# Patient Record
Sex: Male | Born: 1955 | Race: White | Hispanic: No | Marital: Single | Smoking: Current every day smoker
Health system: Southern US, Community
[De-identification: ages and names within clinical notes are randomized; demographics above are authoritative.]

## PROBLEM LIST (undated history)

## (undated) DIAGNOSIS — I779 Disorder of arteries and arterioles, unspecified: Secondary | ICD-10-CM

## (undated) DIAGNOSIS — I6522 Occlusion and stenosis of left carotid artery: Secondary | ICD-10-CM

## (undated) DIAGNOSIS — Z8719 Personal history of other diseases of the digestive system: Secondary | ICD-10-CM

## (undated) DIAGNOSIS — J449 Chronic obstructive pulmonary disease, unspecified: Secondary | ICD-10-CM

## (undated) DIAGNOSIS — I6529 Occlusion and stenosis of unspecified carotid artery: Secondary | ICD-10-CM

## (undated) DIAGNOSIS — I700 Atherosclerosis of aorta: Secondary | ICD-10-CM

## (undated) DIAGNOSIS — E119 Type 2 diabetes mellitus without complications: Secondary | ICD-10-CM

## (undated) DIAGNOSIS — F419 Anxiety disorder, unspecified: Secondary | ICD-10-CM

## (undated) DIAGNOSIS — I5032 Chronic diastolic (congestive) heart failure: Secondary | ICD-10-CM

## (undated) DIAGNOSIS — I2510 Atherosclerotic heart disease of native coronary artery without angina pectoris: Secondary | ICD-10-CM

## (undated) DIAGNOSIS — Z720 Tobacco use: Secondary | ICD-10-CM

## (undated) DIAGNOSIS — J309 Allergic rhinitis, unspecified: Secondary | ICD-10-CM

## (undated) DIAGNOSIS — K219 Gastro-esophageal reflux disease without esophagitis: Secondary | ICD-10-CM

## (undated) DIAGNOSIS — Z9289 Personal history of other medical treatment: Secondary | ICD-10-CM

## (undated) DIAGNOSIS — I4510 Unspecified right bundle-branch block: Secondary | ICD-10-CM

## (undated) DIAGNOSIS — R918 Other nonspecific abnormal finding of lung field: Secondary | ICD-10-CM

## (undated) DIAGNOSIS — M1990 Unspecified osteoarthritis, unspecified site: Secondary | ICD-10-CM

## (undated) HISTORY — DX: Atherosclerotic heart disease of native coronary artery without angina pectoris: I25.10

## (undated) HISTORY — PX: COLONOSCOPY W/ POLYPECTOMY: SHX1380

## (undated) HISTORY — DX: Personal history of other medical treatment: Z92.89

## (undated) HISTORY — DX: Tobacco use: Z72.0

## (undated) HISTORY — DX: Allergic rhinitis, unspecified: J30.9

## (undated) HISTORY — DX: Other nonspecific abnormal finding of lung field: R91.8

## (undated) HISTORY — DX: Type 2 diabetes mellitus without complications: E11.9

## (undated) HISTORY — DX: Atherosclerosis of aorta: I70.0

## (undated) HISTORY — DX: Occlusion and stenosis of unspecified carotid artery: I65.29

## (undated) HISTORY — DX: Chronic diastolic (congestive) heart failure: I50.32

## (undated) HISTORY — PX: WISDOM TOOTH EXTRACTION: SHX21

## (undated) HISTORY — DX: Disorder of arteries and arterioles, unspecified: I77.9

## (undated) HISTORY — PX: TONSILLECTOMY: SUR1361

## (undated) HISTORY — DX: Unspecified right bundle-branch block: I45.10

---

## 2006-01-07 ENCOUNTER — Encounter: Admission: RE | Admit: 2006-01-07 | Discharge: 2006-01-07 | Payer: Self-pay | Admitting: Internal Medicine

## 2010-09-24 ENCOUNTER — Encounter: Payer: Self-pay | Admitting: Internal Medicine

## 2014-03-03 ENCOUNTER — Ambulatory Visit
Admission: RE | Admit: 2014-03-03 | Discharge: 2014-03-03 | Disposition: A | Discharge status: Home / Self Care | Payer: BC Managed Care – PPO | Source: Ambulatory Visit | Attending: Internal Medicine | Admitting: Internal Medicine

## 2014-03-03 ENCOUNTER — Other Ambulatory Visit: Payer: Self-pay | Admitting: Internal Medicine

## 2014-03-03 DIAGNOSIS — F17200 Nicotine dependence, unspecified, uncomplicated: Secondary | ICD-10-CM

## 2014-12-09 ENCOUNTER — Ambulatory Visit (INDEPENDENT_AMBULATORY_CARE_PROVIDER_SITE_OTHER): Payer: BLUE CROSS/BLUE SHIELD | Admitting: Podiatry

## 2014-12-09 ENCOUNTER — Encounter: Payer: Self-pay | Admitting: Podiatry

## 2014-12-09 ENCOUNTER — Ambulatory Visit (INDEPENDENT_AMBULATORY_CARE_PROVIDER_SITE_OTHER): Payer: BLUE CROSS/BLUE SHIELD

## 2014-12-09 VITALS — BP 116/77 | HR 85 | Resp 16 | Ht 66.0 in | Wt 115.0 lb

## 2014-12-09 DIAGNOSIS — M722 Plantar fascial fibromatosis: Secondary | ICD-10-CM

## 2014-12-09 NOTE — Progress Notes (Signed)
   Subjective:    Patient ID: Jose Welch, male    DOB: [birth year], 58 y.o.   MRN: [id number]  HPI 59 year old male presents the office today with complaints of the soft tissue mass on the right foot. He states that he first noticed the mass last week. He states that it was larger has decreased. He was soaking his foot in Epson salts what seems to help alleviate some of the symptoms. He states he has pain particularly with pressure and weightbearing. Denies any overlying skin change or any coloration changes. No other complaints at this time.   Review of Systems  HENT: Positive for sinus pressure.   Respiratory: Positive for cough.   All other systems reviewed and are negative.      Objective:   Physical Exam AAO x3, NAD DP/PT pulses palpable bilaterally, CRT less than 3 seconds Protective sensation intact with Simms Weinstein monofilament, vibratory sensation intact, Achilles tendon reflex intact On the plantar aspect of the right foot just proximal to the second metatarsal to there is approximately 1 x 1 cm encapsulated firm soft tissue mass. There is tenderness to palpation directly overlying the mass. There is no overlying skin change. There is no overlying erythema, increase in warmth, edema. There is no other masses identified bilaterally. No areas of tenderness to bilateral lower extremities. MMT 5/5, ROM WNL.  No open lesions or pre-ulcerative lesions.  No overlying edema, erythema, increase in warmth to bilateral lower extremities.  No pain with calf compression, swelling, warmth, erythema bilaterally.      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 -Discussed likely etiology of the mass. -Discussed various treatment options with him. This time his left proceed with steroid injection. Risks  and complications of injection were discussed the patient for which he verbally understood and consented 2. Under sterile conditions a total of 1 mL mixture of dexamethasone phosphate and 0.5% Marcaine plain was infiltrated into and around the soft tissue mass. Patient tolerated the injection well without complications. Post injection care was discussed the patient. -Dispensed offloading pads. -Follow-up in 3 weeks or sooner if any problems are to arise. If symptoms continue and will likely obtain an MRI. Follow-up with PCP for other she is mentioned in the review of systems

## 2014-12-10 ENCOUNTER — Encounter: Payer: Self-pay | Admitting: Podiatry

## 2014-12-17 ENCOUNTER — Ambulatory Visit: Payer: Self-pay | Admitting: Podiatry

## 2014-12-30 ENCOUNTER — Ambulatory Visit: Payer: BLUE CROSS/BLUE SHIELD | Admitting: Podiatry

## 2015-04-28 ENCOUNTER — Other Ambulatory Visit: Payer: Self-pay | Admitting: Gastroenterology

## 2015-07-07 ENCOUNTER — Encounter (HOSPITAL_COMMUNITY): Payer: Self-pay | Admitting: *Deleted

## 2015-07-08 ENCOUNTER — Other Ambulatory Visit: Payer: Self-pay | Admitting: Gastroenterology

## 2015-07-18 ENCOUNTER — Encounter (HOSPITAL_COMMUNITY)
Admission: RE | Disposition: A | Discharge status: Home / Self Care | Payer: Self-pay | Source: Ambulatory Visit | Attending: Gastroenterology

## 2015-07-18 ENCOUNTER — Ambulatory Visit (HOSPITAL_COMMUNITY): Payer: BLUE CROSS/BLUE SHIELD | Admitting: Anesthesiology

## 2015-07-18 ENCOUNTER — Encounter (HOSPITAL_COMMUNITY): Payer: Self-pay

## 2015-07-18 ENCOUNTER — Ambulatory Visit (HOSPITAL_COMMUNITY)
Admission: RE | Admit: 2015-07-18 | Discharge: 2015-07-18 | Disposition: A | Discharge status: Home / Self Care | Payer: BLUE CROSS/BLUE SHIELD | Source: Ambulatory Visit | Attending: Gastroenterology | Admitting: Gastroenterology

## 2015-07-18 DIAGNOSIS — Z1211 Encounter for screening for malignant neoplasm of colon: Secondary | ICD-10-CM | POA: Insufficient documentation

## 2015-07-18 DIAGNOSIS — F17210 Nicotine dependence, cigarettes, uncomplicated: Secondary | ICD-10-CM | POA: Insufficient documentation

## 2015-07-18 DIAGNOSIS — J449 Chronic obstructive pulmonary disease, unspecified: Secondary | ICD-10-CM | POA: Insufficient documentation

## 2015-07-18 DIAGNOSIS — K219 Gastro-esophageal reflux disease without esophagitis: Secondary | ICD-10-CM | POA: Diagnosis not present

## 2015-07-18 DIAGNOSIS — R911 Solitary pulmonary nodule: Secondary | ICD-10-CM | POA: Insufficient documentation

## 2015-07-18 HISTORY — DX: Gastro-esophageal reflux disease without esophagitis: K21.9

## 2015-07-18 HISTORY — DX: Anxiety disorder, unspecified: F41.9

## 2015-07-18 HISTORY — PX: COLONOSCOPY WITH PROPOFOL: SHX5780

## 2015-07-18 SURGERY — COLONOSCOPY WITH PROPOFOL
Anesthesia: Monitor Anesthesia Care

## 2015-07-18 MED ORDER — SODIUM CHLORIDE 0.9 % IV SOLN: INTRAVENOUS | Status: DC

## 2015-07-18 MED ORDER — PROPOFOL 10 MG/ML IV BOLUS
INTRAVENOUS | Status: AC
  Filled 2015-07-18: qty 20

## 2015-07-18 MED ORDER — FENTANYL CITRATE (PF) 100 MCG/2ML IJ SOLN
25.0000 ug | INTRAMUSCULAR | Status: DC | PRN
Start: 2015-07-18 — End: 2015-07-18

## 2015-07-18 MED ORDER — OXYCODONE HCL 5 MG PO TABS: 5.0000 mg | ORAL_TABLET | Freq: Once | ORAL | Status: DC | PRN

## 2015-07-18 MED ORDER — LIDOCAINE HCL (CARDIAC) 20 MG/ML IV SOLN
INTRAVENOUS | Status: DC | PRN
  Administered 2015-07-18 (×2): 50 mg via INTRAVENOUS

## 2015-07-18 MED ORDER — LIDOCAINE HCL (CARDIAC) 20 MG/ML IV SOLN
INTRAVENOUS | Status: AC
  Filled 2015-07-18: qty 5

## 2015-07-18 MED ORDER — PROPOFOL 10 MG/ML IV BOLUS
INTRAVENOUS | Status: DC | PRN
  Administered 2015-07-18: 10 mg via INTRAVENOUS
  Administered 2015-07-18: 50 mg via INTRAVENOUS
  Administered 2015-07-18: 20 mg via INTRAVENOUS
  Administered 2015-07-18: 10 mg via INTRAVENOUS
  Administered 2015-07-18: 30 mg via INTRAVENOUS
  Administered 2015-07-18: 40 mg via INTRAVENOUS
  Administered 2015-07-18: 20 mg via INTRAVENOUS
  Administered 2015-07-18: 50 mg via INTRAVENOUS
  Administered 2015-07-18: 20 mg via INTRAVENOUS
  Administered 2015-07-18: 10 mg via INTRAVENOUS

## 2015-07-18 MED ORDER — ONDANSETRON HCL 4 MG/2ML IJ SOLN
4.0000 mg | Freq: Four times a day (QID) | INTRAMUSCULAR | Status: DC | PRN
Start: 2015-07-18 — End: 2015-07-18

## 2015-07-18 MED ORDER — LACTATED RINGERS IV SOLN
INTRAVENOUS | Status: DC
  Administered 2015-07-18: 1000 mL via INTRAVENOUS

## 2015-07-18 MED ORDER — OXYCODONE HCL 5 MG/5ML PO SOLN: 5.0000 mg | Freq: Once | ORAL | Status: DC | PRN

## 2015-07-18 SURGICAL SUPPLY — 21 items

## 2015-07-18 NOTE — Discharge Instructions (Signed)
Colonoscopy, Care After °Refer to this sheet in the next few weeks. These instructions provide you with information on caring for yourself after your procedure. Your health care provider may also give you more specific instructions. Your treatment has been planned according to current medical practices, but problems sometimes occur. Call your health care provider if you have any problems or questions after your procedure. °WHAT TO EXPECT AFTER THE PROCEDURE  °After your procedure, it is typical to have the following: °· A small amount of blood in your stool. °· Moderate amounts of gas and mild abdominal cramping or bloating. °HOME CARE INSTRUCTIONS °· Do not drive, operate machinery, or sign important documents for 24 hours. °· You may shower and resume your regular physical activities, but move at a slower pace for the first 24 hours. °· Take frequent rest periods for the first 24 hours. °· Walk around or put a warm pack on your abdomen to help reduce abdominal cramping and bloating. °· Drink enough fluids to keep your urine clear or pale yellow. °· You may resume your normal diet as instructed by your health care provider. Avoid heavy or fried foods that are hard to digest. °· Avoid drinking alcohol for 24 hours or as instructed by your health care provider. °· Only take over-the-counter or prescription medicines as directed by your health care provider. °· If a tissue sample (biopsy) was taken during your procedure: °¨ Do not take aspirin or blood thinners for 7 days, or as instructed by your health care provider. °¨ Do not drink alcohol for 7 days, or as instructed by your health care provider. °¨ Eat soft foods for the first 24 hours. °SEEK MEDICAL CARE IF: °You have persistent spotting of blood in your stool 2-3 days after the procedure. °SEEK IMMEDIATE MEDICAL CARE IF: °· You have more than a small spotting of blood in your stool. °· You pass large blood clots in your stool. °· Your abdomen is swollen  (distended). °· You have nausea or vomiting. °· You have a fever. °· You have increasing abdominal pain that is not relieved with medicine. °  °This information is not intended to replace advice given to you by your health care provider. Make sure you discuss any questions you have with your health care provider. °  °Document Released: 04/03/2004 Document Revised: 06/10/2013 Document Reviewed: 04/27/2013 °Elsevier Interactive Patient Education ©2016 [employer] ° °

## 2015-07-18 NOTE — H&P (Signed)
  Procedure: Screening colonoscopy  History: The patient is a 59 year old male born 08/15/1956. He is scheduled to undergo a screening colonoscopy today.  Past medical history: Chronic cigarette smoking. Tonsillectomy as a child. Gastroesophageal reflux. Insomnia. Chronic obstructive pulmonary disease. Right lung nodule.  Exam: The patient is alert and lying comfortably on the endoscopy stretcher. Abdomen is soft and nontender to palpation. Lungs are clear to auscultation. Cardiac exam reveals a regular rhythm.  Plan: Proceed with screening colonoscopy

## 2015-07-18 NOTE — Anesthesia Preprocedure Evaluation (Signed)
Anesthesia Evaluation  Patient identified by MRN, date of birth, ID band Patient awake    Reviewed: Allergy & Precautions, NPO status   Airway Mallampati: II   Neck ROM: full    Dental   Pulmonary Current Smoker,    breath sounds clear to auscultation       Cardiovascular negative cardio ROS   Rhythm:regular Rate:Normal     Neuro/Psych Anxiety    GI/Hepatic GERD  ,  Endo/Other    Renal/GU      Musculoskeletal   Abdominal   Peds  Hematology   Anesthesia Other Findings   Reproductive/Obstetrics                             Anesthesia Physical Anesthesia Plan  ASA: II  Anesthesia Plan: MAC   Post-op Pain Management:    Induction: Intravenous  Airway Management Planned: Simple Face Mask  Additional Equipment:   Intra-op Plan:   Post-operative Plan:   Informed Consent: I have reviewed the patients History and Physical, chart, labs and discussed the procedure including the risks, benefits and alternatives for the proposed anesthesia with the patient or authorized representative who has indicated his/her understanding and acceptance.     Plan Discussed with: CRNA, Anesthesiologist and Surgeon  Anesthesia Plan Comments:         Anesthesia Quick Evaluation

## 2015-07-18 NOTE — Transfer of Care (Signed)
Immediate Anesthesia Transfer of Care Note  Patient: Jose Welch  Procedure(s) Performed: Procedure(s): COLONOSCOPY WITH PROPOFOL (N/A)  Patient Location: PACU, endo recovery  Anesthesia Type:MAC  Level of Consciousness: Patient easily awoken, sedated, comfortable, cooperative, following commands, responds to stimulation.   Airway & Oxygen Therapy: Patient spontaneously breathing, ventilating well, oxygen via simple oxygen mask.  Post-op Assessment: Report given to PACU RN, vital signs reviewed and stable, moving all extremities.   Post vital signs: Reviewed and stable.  Complications: No apparent anesthesia complications

## 2015-07-18 NOTE — Anesthesia Postprocedure Evaluation (Signed)
Anesthesia Post Note  Patient: [name]  Procedure(s) Performed: Procedure(s) (LRB): COLONOSCOPY WITH PROPOFOL (N/A)  Anesthesia type: MAC  Patient location: PACU  Post pain: Pain level controlled and Adequate analgesia  Post assessment: Post-op Vital signs reviewed, Patient's Cardiovascular Status Stable and Respiratory Function Stable  Last Vitals:  Filed Vitals:   07/18/15 0840  BP: 146/74  Pulse: 89  Temp:   Resp: 16    Post vital signs: Reviewed and stable  Level of consciousness: awake, alert  and oriented  Complications: No apparent anesthesia complications

## 2015-07-18 NOTE — Op Note (Signed)
Procedure: Screening colonoscopy  Endoscopist: Earle Gell  Premedication: Propofol administered by anesthesia  Procedure: The patient was placed in the left lateral decubitus position. Anal inspection and digital rectal exam were normal. The Pentax pediatric colonoscope was introduced into the rectum and advanced to the cecum. A normal-appearing appendiceal orifice and ileocecal valve were identified. Colonic preparation for the exam today was good. Withdrawal time was 9 minutes  Rectum. Normal. Retroflexed view of the distal rectum was normal  Sigmoid colon and descending colon. Normal  Splenic flexure. Normal  Transverse colon. Normal  Hepatic flexure. Normal  Ascending colon. Normal  Cecum and ileocecal valve. Normal  Assessment: Normal screening colonoscopy  Recommendation: Schedule repeat screening colonoscopy in 10 years

## 2015-07-19 ENCOUNTER — Encounter (HOSPITAL_COMMUNITY): Payer: Self-pay | Admitting: Gastroenterology

## 2015-07-26 ENCOUNTER — Ambulatory Visit (INDEPENDENT_AMBULATORY_CARE_PROVIDER_SITE_OTHER): Payer: BLUE CROSS/BLUE SHIELD | Admitting: Podiatry

## 2015-07-26 ENCOUNTER — Ambulatory Visit (INDEPENDENT_AMBULATORY_CARE_PROVIDER_SITE_OTHER): Payer: BLUE CROSS/BLUE SHIELD

## 2015-07-26 DIAGNOSIS — M792 Neuralgia and neuritis, unspecified: Secondary | ICD-10-CM | POA: Diagnosis not present

## 2015-07-26 DIAGNOSIS — M216X1 Other acquired deformities of right foot: Secondary | ICD-10-CM

## 2015-07-26 DIAGNOSIS — R52 Pain, unspecified: Secondary | ICD-10-CM

## 2015-07-26 MED ORDER — METHYLPREDNISOLONE 4 MG PO TBPK: ORAL_TABLET | ORAL | Status: DC

## 2015-08-01 NOTE — Progress Notes (Signed)
Patient ID: Jose Welch, male   DOB: [birth year], 59 y.o.   MRN: [id number]  Subjective: 58 year old male presents the office today for concerns of right foot tingling in his foot and leg is asleep to the toes. Similar on the last couple weeks. He denies any recent injury or trauma. This was a slow onset. He states the majority of symptoms are the toes and the ball of his foot. He states that the mass after last appointment did resolve but once the mass did resolve he did have some of this numbness to his foot. There is no numbness or sensation to the back of his foot to the midfoot or more proximal. He denies any claudication symptoms. Denies any recent injury or trauma. He said no other treatment. No other complaints at this time.  Objective: General: AAO x3, NAD  Dermatological: Skin is warm, dry and supple bilateral. Nails x 10 are well manicured; remaining integument appears unremarkable at this time. There are no open sores, no preulcerative lesions, no rash or signs of infection present.  Vascular: Dorsalis Pedis artery and Posterior Tibial artery pedal pulses are 2/4 bilateral with immedate capillary fill time. Pedal hair growth present. No varicosities and no lower extremity edema present bilateral. There is no pain with calf compression, swelling, warmth, erythema.   Neruologic: Grossly intact via light touch bilateral. Vibratory intact via tuning fork bilateral. Protective threshold with Semmes Wienstein monofilament intact to all pedal sites bilateral. Patellar and Achilles deep tendon reflexes 2+ bilateral. No Babinski or clonus noted bilateral.   Musculoskeletal: No gross boney pedal deformities bilateral. There is tenderness with patient on the plantar aspect of the metatarsal heads on the 5 on the right foot there is atrophy of the fat pad and prominent metatarsal heads. There is no neuroma identified. There is no pain on the interspaces. There is no pain with MTPJ range of motion.  There is no specific area pinpoint line tenderness or pain vibratory sensation. On the plantar aspect of the right foot just proximal second metatarsal the soft tissue mass which is present last appointment appears to be resolved although this is where he discontinued get the majority the numbness. No other areas of tenderness to bilateral lower extremities. No pain, crepitus, or limitation noted with foot and ankle range of motion bilateral. Muscular strength 5/5 in all groups tested bilateral.  Gait: Unassisted, Nonantalgic.   Assessment: 59 year old male right foot neuritis symptoms  Plan: -Treatment options discussed including all alternatives, risks, and complications -X-rays were obtained and reviewed with the patient.  -Etiology of symptoms were discussed. Likely biomechanical in nature versus residual mass. Neuropathy unlikely. -Offloading pads were dispensed help take pressure off the metatarsal heads to see if this will limit some of the symptoms. -Prescribed Medrol Dosepak. -Continue supportive shoe gear. -If symptoms continue may need further studies. -Follow-up in the next 2-3 weeks or sooner if any problems arise. In the meantime, encouraged to call the office with any questions, concerns, change in symptoms.   Celesta Gentile, DPM

## 2015-08-18 ENCOUNTER — Ambulatory Visit: Payer: BLUE CROSS/BLUE SHIELD | Admitting: Podiatry

## 2016-04-16 DIAGNOSIS — Z1159 Encounter for screening for other viral diseases: Secondary | ICD-10-CM | POA: Diagnosis not present

## 2016-04-16 DIAGNOSIS — Z1389 Encounter for screening for other disorder: Secondary | ICD-10-CM | POA: Diagnosis not present

## 2016-04-16 DIAGNOSIS — Z125 Encounter for screening for malignant neoplasm of prostate: Secondary | ICD-10-CM | POA: Diagnosis not present

## 2016-04-16 DIAGNOSIS — Z0000 Encounter for general adult medical examination without abnormal findings: Secondary | ICD-10-CM | POA: Diagnosis not present

## 2016-04-16 DIAGNOSIS — Z23 Encounter for immunization: Secondary | ICD-10-CM | POA: Diagnosis not present

## 2016-04-24 DIAGNOSIS — J449 Chronic obstructive pulmonary disease, unspecified: Secondary | ICD-10-CM | POA: Diagnosis not present

## 2016-04-26 ENCOUNTER — Ambulatory Visit (INDEPENDENT_AMBULATORY_CARE_PROVIDER_SITE_OTHER): Payer: BLUE CROSS/BLUE SHIELD

## 2016-04-26 ENCOUNTER — Encounter: Payer: Self-pay | Admitting: Podiatry

## 2016-04-26 ENCOUNTER — Ambulatory Visit (INDEPENDENT_AMBULATORY_CARE_PROVIDER_SITE_OTHER): Payer: BLUE CROSS/BLUE SHIELD | Admitting: Podiatry

## 2016-04-26 DIAGNOSIS — M25473 Effusion, unspecified ankle: Secondary | ICD-10-CM | POA: Diagnosis not present

## 2016-04-26 DIAGNOSIS — I8390 Asymptomatic varicose veins of unspecified lower extremity: Secondary | ICD-10-CM

## 2016-04-26 DIAGNOSIS — R52 Pain, unspecified: Secondary | ICD-10-CM

## 2016-04-26 DIAGNOSIS — M216X1 Other acquired deformities of right foot: Secondary | ICD-10-CM

## 2016-04-26 DIAGNOSIS — M792 Neuralgia and neuritis, unspecified: Secondary | ICD-10-CM

## 2016-04-26 DIAGNOSIS — I868 Varicose veins of other specified sites: Secondary | ICD-10-CM

## 2016-04-26 MED ORDER — MELOXICAM 15 MG PO TABS: 15.0000 mg | ORAL_TABLET | Freq: Every day | ORAL | 2 refills | Status: DC

## 2016-04-26 NOTE — Progress Notes (Signed)
Subjective: 60 year old male presents the office today for concerns of his foot and ankle swelling which is been ongoing for quite some times uses no some varicose veins to his ankles. He is inquiring about possible compression socks. 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 Occasional numbness to the right foot and the ball the foot at times. Improvement in ongoing for quite some time. Did improve after his last appointment but is started to come back. Denies any recent injury or trauma. No drainage increase in warmth at this time. No recent treatment otherwise. Denies any systemic complaints such as fevers, chills, nausea, vomiting. No acute changes since last appointment, and no other complaints at this time.   Objective: *The patient would not let me straighten his feet out in order to evaluate him except for short time AAO x3, NAD DP/PT pulses palpable bilaterally, CRT less than 3 seconds Sensation intact with Simms Weinstein monofilament, vibratory sensation intact. There is prominent metatarsal heads plantarly with atrophy of fat pad. There is no specific area pinpoint bony tenderness or pain the vibratory sensation of bilateral foot and ankle. There is trace edema to the right and left ankle with some varicose veins no erythema or increase in warmth. He is able to walk any pain. No edema, erythema, increase in warmth to bilateral lower extremities.  No open lesions or pre-ulcerative lesions.  No pain with calf compression, swelling, warmth, erythema  Assessment: Metatarsalgia, varicose veins with ankle swelling  Plan: -All treatment options discussed with the patient including all alternatives, risks, complications.  -Dispensed anklet Phyllis swelling as well as provided prescriptions for compression socks at his request to help with varicose veins with swelling as he stands all day at work. Dispensed metatarsal offloading pads. Prescribed mobic. Discussed side effects of the  medication and directed to stop if any are to occur and call the office.  -Follow-up as scheduled or sooner if needed. -Patient encouraged to call the office with any questions, concerns, change in symptoms.   Celesta Gentile, DPM

## 2016-04-27 ENCOUNTER — Telehealth: Payer: Self-pay | Admitting: *Deleted

## 2016-04-27 NOTE — Telephone Encounter (Signed)
Pt asked where the medication was sent to. Left message informing pt the Meloxicam had been sent to the [facility] on 04/26/2016 at 4:24pm and confirmed received.

## 2016-10-17 DIAGNOSIS — F17210 Nicotine dependence, cigarettes, uncomplicated: Secondary | ICD-10-CM | POA: Diagnosis not present

## 2016-10-17 DIAGNOSIS — E46 Unspecified protein-calorie malnutrition: Secondary | ICD-10-CM | POA: Diagnosis not present

## 2016-10-17 DIAGNOSIS — J449 Chronic obstructive pulmonary disease, unspecified: Secondary | ICD-10-CM | POA: Diagnosis not present

## 2016-10-17 DIAGNOSIS — K219 Gastro-esophageal reflux disease without esophagitis: Secondary | ICD-10-CM | POA: Diagnosis not present

## 2017-01-15 DIAGNOSIS — H269 Unspecified cataract: Secondary | ICD-10-CM | POA: Diagnosis not present

## 2017-01-15 DIAGNOSIS — H539 Unspecified visual disturbance: Secondary | ICD-10-CM | POA: Diagnosis not present

## 2017-01-15 DIAGNOSIS — H2513 Age-related nuclear cataract, bilateral: Secondary | ICD-10-CM | POA: Diagnosis not present

## 2017-01-17 ENCOUNTER — Other Ambulatory Visit: Payer: Self-pay | Admitting: Internal Medicine

## 2017-01-17 DIAGNOSIS — H538 Other visual disturbances: Secondary | ICD-10-CM

## 2017-01-17 DIAGNOSIS — H539 Unspecified visual disturbance: Secondary | ICD-10-CM | POA: Diagnosis not present

## 2017-01-21 ENCOUNTER — Ambulatory Visit
Admission: RE | Admit: 2017-01-21 | Discharge: 2017-01-21 | Disposition: A | Discharge status: Home / Self Care | Payer: BLUE CROSS/BLUE SHIELD | Source: Ambulatory Visit | Attending: Internal Medicine | Admitting: Internal Medicine

## 2017-01-21 DIAGNOSIS — I6523 Occlusion and stenosis of bilateral carotid arteries: Secondary | ICD-10-CM | POA: Diagnosis not present

## 2017-01-21 DIAGNOSIS — H538 Other visual disturbances: Secondary | ICD-10-CM

## 2017-01-30 ENCOUNTER — Other Ambulatory Visit: Payer: Self-pay | Admitting: *Deleted

## 2017-01-30 DIAGNOSIS — I6521 Occlusion and stenosis of right carotid artery: Secondary | ICD-10-CM

## 2017-01-31 ENCOUNTER — Ambulatory Visit (HOSPITAL_COMMUNITY)
Admission: RE | Admit: 2017-01-31 | Discharge: 2017-01-31 | Disposition: A | Discharge status: Home / Self Care | Payer: BLUE CROSS/BLUE SHIELD | Source: Ambulatory Visit | Attending: Vascular Surgery | Admitting: Vascular Surgery

## 2017-01-31 DIAGNOSIS — I6521 Occlusion and stenosis of right carotid artery: Secondary | ICD-10-CM

## 2017-01-31 DIAGNOSIS — I6523 Occlusion and stenosis of bilateral carotid arteries: Secondary | ICD-10-CM | POA: Insufficient documentation

## 2017-01-31 LAB — VAS US CAROTID
LCCADSYS: 108 cm/s
LCCAPDIAS: 26 cm/s
LCCAPSYS: 94 cm/s
LEFT ECA DIAS: -36 cm/s
LEFT VERTEBRAL DIAS: -19 cm/s
LICADDIAS: -9 cm/s
LICADSYS: -23 cm/s
LICAPSYS: -189 cm/s
Left CCA dist dias: 28 cm/s
Left ICA prox dias: -52 cm/s
RCCAPSYS: 101 cm/s
RIGHT CCA MID DIAS: 24 cm/s
RIGHT ECA DIAS: -10 cm/s
RIGHT VERTEBRAL DIAS: -32 cm/s
Right CCA prox dias: 23 cm/s
Right cca dist sys: -61 cm/s

## 2017-02-07 ENCOUNTER — Encounter: Payer: Self-pay | Admitting: Surgery

## 2017-02-13 ENCOUNTER — Encounter: Payer: Self-pay | Admitting: Surgery

## 2017-02-13 ENCOUNTER — Ambulatory Visit (INDEPENDENT_AMBULATORY_CARE_PROVIDER_SITE_OTHER): Payer: BLUE CROSS/BLUE SHIELD | Admitting: Surgery

## 2017-02-13 VITALS — BP 145/88 | HR 89 | Temp 97.6°F | Resp 20 | Ht 66.0 in | Wt 110.0 lb

## 2017-02-13 DIAGNOSIS — I6523 Occlusion and stenosis of bilateral carotid arteries: Secondary | ICD-10-CM | POA: Diagnosis not present

## 2017-02-13 NOTE — Progress Notes (Signed)
[facility] [place]  Patient name: Jose Welch MRN: [id number] DOB: 1955/11/11 Sex: male   REQUESTING PROVIDER:    Dr. Lysle Rubens   REASON FOR CONSULT:    Carotid stenosis  HISTORY OF PRESENT ILLNESS:   Jose Welch is a 61 y.o. male, who is Referred today for evaluation of carotid stenosis.  The patient recently lost vision in his left eye.  This was done while he was at work.  He does describe bending down.  He was very hot.  This lasted about 30 seconds.  He says his vision wasn't white.  He has been seen by ophthalmology who did not find any abnormalities.  He had a carotid ultrasound which showed some disease on the right and minimal on the left.  The patient is a current everyday smoker.  He has a history of alcohol abuse.  He takes [place] powders every day.  He suffers from reflux disease.  PAST MEDICAL HISTORY    Past Medical History:  Diagnosis Date  . Anxiety   . GERD (gastroesophageal reflux disease)      FAMILY HISTORY   Negative for premature cardiovascular disease  SOCIAL HISTORY:   Social History   Social History  . Marital status: Single    Spouse name: N/A  . Number of children: N/A  . Years of education: N/A   Occupational History  . Not on file.   Social History Main Topics  . Smoking status: Current Every Day Smoker  . Smokeless tobacco: Not on file  . Alcohol use Not on file  . Drug use: No  . Sexual activity: Not on file   Other Topics Concern  . Not on file   Social History Narrative  . No narrative on file    ALLERGIES:    No Known Allergies  CURRENT MEDICATIONS:    Current Outpatient Prescriptions  Medication Sig Dispense Refill  . ALPRAZolam (XANAX) 0.25 MG tablet Take 0.25 mg by mouth at bedtime as needed for anxiety.    . Aspirin-Acetaminophen-Caffeine (GOODY HEADACHE PO) Take 1 packet by mouth 2 (two) times daily as needed (pain).    . meloxicam (MOBIC) 15 MG  tablet Take 1 tablet (15 mg total) by mouth daily. 30 tablet 2  . methylPREDNISolone (MEDROL DOSEPAK) 4 MG TBPK tablet Take as directed 21 tablet 0   No current facility-administered medications for this visit.     REVIEW OF SYSTEMS:   [X]  denotes positive finding, [ ]  denotes negative finding Cardiac  Comments:  Chest pain or chest pressure:    Shortness of breath upon exertion:    Short of breath when lying flat:    Irregular heart rhythm:        Vascular    Pain in calf, thigh, or hip brought on by ambulation:    Pain in feet at night that wakes you up from your sleep:     Blood clot in your veins:    Leg swelling:         Pulmonary    Oxygen at home:    Productive cough:     Wheezing:         Neurologic    Sudden weakness in arms or legs:     Sudden numbness in arms or legs:     Sudden onset of difficulty speaking or slurred speech:    Temporary loss of vision in one eye:     Problems with dizziness:  Gastrointestinal    Blood in stool:      Vomited blood:         Genitourinary    Burning when urinating:     Blood in urine:        Psychiatric    Major depression:         Hematologic    Bleeding problems:    Problems with blood clotting too easily:        Skin    Rashes or ulcers:        Constitutional    Fever or chills:     PHYSICAL EXAM:   There were no vitals filed for this visit.  GENERAL: The patient is a well-nourished male, in no acute distress. The vital signs are documented above. CARDIAC: There is a regular rate and rhythm.  VASCULAR: Palpable pedal pulses bilaterally.  Positive left carotid bruit. PULMONARY: Nonlabored respirations MUSCULOSKELETAL: There are no major deformities or cyanosis. NEUROLOGIC: No focal weakness or paresthesias are detected. SKIN: There are no ulcers or rashes noted. PSYCHIATRIC: The patient has a normal affect.  STUDIES:   Vascular lab studies were performed in my office which were independently  reviewed by myself with the following findings: 60-79% right carotid stenosis, 40-59% left carotid stenosis  ASSESSMENT and PLAN   Asymptomatic carotid stenosis: Based on the way the patient describes his episode of vision loss, it is not classic for amaurosis fugax.  In addition, he has only mild stenosis on the left based on duplex ultrasound.  Therefore, at this point I would only recommend medical management for his carotid disease.  We spent a significant amount of time discussing the importance of smoking cessation.  He understands that but is somewhat reluctant to consider it.  I would like for him to be started on a statin.  In addition I told him that I would like for him to take a baby aspirin.  I also cautioned him about taking Goody's powder every day.  I have him scheduled to see me back again in 6 months with a carotid duplex.   [name], MD [facility] [phone] Pager [phone]

## 2017-02-19 NOTE — Addendum Note (Signed)
Addended by: Lianne Cure A on: 02/19/2017 04:48 PM   Modules accepted: Orders

## 2017-04-17 DIAGNOSIS — K219 Gastro-esophageal reflux disease without esophagitis: Secondary | ICD-10-CM | POA: Diagnosis not present

## 2017-04-17 DIAGNOSIS — Z125 Encounter for screening for malignant neoplasm of prostate: Secondary | ICD-10-CM | POA: Diagnosis not present

## 2017-04-17 DIAGNOSIS — R7301 Impaired fasting glucose: Secondary | ICD-10-CM | POA: Diagnosis not present

## 2017-04-17 DIAGNOSIS — J449 Chronic obstructive pulmonary disease, unspecified: Secondary | ICD-10-CM | POA: Diagnosis not present

## 2017-04-17 DIAGNOSIS — F17210 Nicotine dependence, cigarettes, uncomplicated: Secondary | ICD-10-CM | POA: Diagnosis not present

## 2017-04-17 DIAGNOSIS — J310 Chronic rhinitis: Secondary | ICD-10-CM | POA: Diagnosis not present

## 2017-04-17 DIAGNOSIS — Z0000 Encounter for general adult medical examination without abnormal findings: Secondary | ICD-10-CM | POA: Diagnosis not present

## 2017-07-17 DIAGNOSIS — E782 Mixed hyperlipidemia: Secondary | ICD-10-CM | POA: Diagnosis not present

## 2017-07-17 DIAGNOSIS — R7303 Prediabetes: Secondary | ICD-10-CM | POA: Diagnosis not present

## 2017-08-19 ENCOUNTER — Ambulatory Visit (HOSPITAL_COMMUNITY)
Admission: RE | Admit: 2017-08-19 | Discharge: 2017-08-19 | Disposition: A | Discharge status: Home / Self Care | Payer: BLUE CROSS/BLUE SHIELD | Source: Ambulatory Visit | Attending: Surgery | Admitting: Surgery

## 2017-08-19 ENCOUNTER — Other Ambulatory Visit: Payer: Self-pay | Admitting: *Deleted

## 2017-08-19 ENCOUNTER — Encounter: Payer: Self-pay | Admitting: Family

## 2017-08-19 ENCOUNTER — Encounter: Payer: Self-pay | Admitting: *Deleted

## 2017-08-19 ENCOUNTER — Ambulatory Visit (INDEPENDENT_AMBULATORY_CARE_PROVIDER_SITE_OTHER): Payer: BLUE CROSS/BLUE SHIELD | Admitting: Surgery

## 2017-08-19 VITALS — BP 149/93 | HR 105 | Temp 97.5°F | Resp 18 | Wt 113.6 lb

## 2017-08-19 DIAGNOSIS — I6523 Occlusion and stenosis of bilateral carotid arteries: Secondary | ICD-10-CM | POA: Diagnosis not present

## 2017-08-19 DIAGNOSIS — F17200 Nicotine dependence, unspecified, uncomplicated: Secondary | ICD-10-CM

## 2017-08-19 DIAGNOSIS — Z01810 Encounter for preprocedural cardiovascular examination: Secondary | ICD-10-CM

## 2017-08-19 LAB — VAS US CAROTID
LEFT ECA DIAS: -9 cm/s
LEFT VERTEBRAL DIAS: 23 cm/s
LICAPDIAS: -226 cm/s
Left CCA dist dias: -42 cm/s
Left CCA dist sys: -119 cm/s
Left CCA prox dias: 30 cm/s
Left CCA prox sys: 82 cm/s
Left ICA dist dias: -35 cm/s
Left ICA dist sys: -83 cm/s
Left ICA prox sys: -503 cm/s
RCCAPDIAS: 21 cm/s
RCCAPSYS: 95 cm/s
RIGHT CCA MID DIAS: 29 cm/s
RIGHT ECA DIAS: 28 cm/s
RIGHT VERTEBRAL DIAS: -33 cm/s
Right cca dist sys: -108 cm/s

## 2017-08-19 NOTE — Patient Instructions (Signed)
Steps to Quit Smoking Smoking tobacco can be bad for your health. It can also affect almost every organ in your body. Smoking puts you and people around you at risk for many serious long-lasting (chronic) diseases. Quitting smoking is hard, but it is one of the best things that you can do for your health. It is never too late to quit. What are the benefits of quitting smoking? When you quit smoking, you lower your risk for getting serious diseases and conditions. They can include:  Lung cancer or lung disease.  Heart disease.  Stroke.  Heart attack.  Not being able to have children (infertility).  Weak bones (osteoporosis) and broken bones (fractures).  If you have coughing, wheezing, and shortness of breath, those symptoms may get better when you quit. You may also get sick less often. If you are pregnant, quitting smoking can help to lower your chances of having a baby of low birth weight. What can I do to help me quit smoking? Talk with your doctor about what can help you quit smoking. Some things you can do (strategies) include:  Quitting smoking totally, instead of slowly cutting back how much you smoke over a period of time.  Going to in-person counseling. You are more likely to quit if you go to many counseling sessions.  Using resources and support systems, such as: ? Online chats with a counselor. ? Phone quitlines. ? Printed self-help materials. ? Support groups or group counseling. ? Text messaging programs. ? Mobile phone apps or applications.  Taking medicines. Some of these medicines may have nicotine in them. If you are pregnant or breastfeeding, do not take any medicines to quit smoking unless your doctor says it is okay. Talk with your doctor about counseling or other things that can help you.  Talk with your doctor about using more than one strategy at the same time, such as taking medicines while you are also going to in-person counseling. This can help make  quitting easier. What things can I do to make it easier to quit? Quitting smoking might feel very hard at first, but there is a lot that you can do to make it easier. Take these steps:  Talk to your family and friends. Ask them to support and encourage you.  Call phone quitlines, reach out to support groups, or work with a counselor.  Ask people who smoke to not smoke around you.  Avoid places that make you want (trigger) to smoke, such as: ? Bars. ? Parties. ? Smoke-break areas at work.  Spend time with people who do not smoke.  Lower the stress in your life. Stress can make you want to smoke. Try these things to help your stress: ? Getting regular exercise. ? Deep-breathing exercises. ? Yoga. ? Meditating. ? Doing a body scan. To do this, close your eyes, focus on one area of your body at a time from head to toe, and notice which parts of your body are tense. Try to relax the muscles in those areas.  Download or buy apps on your mobile phone or tablet that can help you stick to your quit plan. There are many free apps, such as QuitGuide from the CDC (Centers for Disease Control and Prevention). You can find more support from smokefree.gov and other websites.  This information is not intended to replace advice given to you by your health care provider. Make sure you discuss any questions you have with your health care provider. Document Released: 06/16/2009 Document   Revised: 04/17/2016 Document Reviewed: 01/04/2015 Elsevier Interactive Patient Education  2018 Elsevier Inc.     Stroke Prevention Some health problems and behaviors may make it more likely for you to have a stroke. Below are ways to lessen your risk of having a stroke.  Be active for at least 30 minutes on most or all days.  Do not smoke. Try not to be around others who smoke.  Do not drink too much alcohol. ? Do not have more than 2 drinks a day if you are a man. ? Do not have more than 1 drink a day if you  are a woman and are not pregnant.  Eat healthy foods, such as fruits and vegetables. If you were put on a specific diet, follow the diet as told.  Keep your cholesterol levels under control through diet and medicines. Look for foods that are low in saturated fat, trans fat, cholesterol, and are high in fiber.  If you have diabetes, follow all diet plans and take your medicine as told.  Ask your doctor if you need treatment to lower your blood pressure. If you have high blood pressure (hypertension), follow all diet plans and take your medicine as told by your doctor.  If you are 18-39 years old, have your blood pressure checked every 3-5 years. If you are age 40 or older, have your blood pressure checked every year.  Keep a healthy weight. Eat foods that are low in calories, salt, saturated fat, trans fat, and cholesterol.  Do not take drugs.  Avoid birth control pills, if this applies. Talk to your doctor about the risks of taking birth control pills.  Talk to your doctor if you have sleep problems (sleep apnea).  Take all medicine as told by your doctor. ? You may be told to take aspirin or blood thinner medicine. Take this medicine as told by your doctor. ? Understand your medicine instructions.  Make sure any other conditions you have are being taken care of.  Get help right away if:  You suddenly lose feeling (you feel numb) or have weakness in your face, arm, or leg.  Your face or eyelid hangs down to one side.  You suddenly feel confused.  You have trouble talking (aphasia) or understanding what people are saying.  You suddenly have trouble seeing in one or both eyes.  You suddenly have trouble walking.  You are dizzy.  You lose your balance or your movements are clumsy (uncoordinated).  You suddenly have a very bad headache and you do not know the cause.  You have new chest pain.  Your heart feels like it is fluttering or skipping a beat (irregular  heartbeat). Do not wait to see if the symptoms above go away. Get help right away. Call your local emergency services (911 in [place] Do not drive yourself to the hospital. This information is not intended to replace advice given to you by your health care provider. Make sure you discuss any questions you have with your health care provider. Document Released: 02/19/2012 Document Revised: 01/26/2016 Document Reviewed: 02/20/2013 Elsevier Interactive Patient Education  2018 Elsevier Inc.  

## 2017-08-19 NOTE — H&P (View-Only) (Signed)
[facility]  Patient name: Jose Welch MRN: [id number] DOB: June 07, 1956 Sex: male   REASON FOR VISIT:    Follow-up carotid disease  HISOTRY OF PRESENT ILLNESS:    Jose Welch is a 61 y.o. male who is back today for six-month follow-up of his carotid disease.  I initially saw him in June 2018.  At that time he had a transient episode of vision loss in his left eye.  He describes this is happening for about 30 seconds while he bent down on a very hot day.  He was seen by ophthalmology who did not find any abnormalities.  A carotid duplex showed 40-59% left carotid stenosis and 60-79% right carotid stenosis.  The patient's episode was not felt to be amaurosis, and in addition the stenosis was not that significant on the left side and so medical therapy was recommended.  He is back today for a repeat ultrasound.  He has not had any interval symptoms.  The patient is a current everyday smoker.  He has a history of alcohol abuse.  He takes [place] powders every day.  He suffers from reflux disease.  PAST MEDICAL HISTORY:   Past Medical History:  Diagnosis Date  . Anxiety   . Carotid artery occlusion   . GERD (gastroesophageal reflux disease)      FAMILY HISTORY:   No family history on file.  SOCIAL HISTORY:   Social History   Tobacco Use  . Smoking status: Current Every Day Smoker    Packs/day: 1.00    Types: Cigarettes  . Smokeless tobacco: Never Used  Substance Use Topics  . Alcohol use: Yes    Alcohol/week: 0.0 oz    Comment: occasional     ALLERGIES:   No Known Allergies   CURRENT MEDICATIONS:   Current Outpatient Medications  Medication Sig Dispense Refill  . ALPRAZolam (XANAX) 0.25 MG tablet Take 0.25 mg by mouth at bedtime as needed for anxiety.    . Aspirin-Acetaminophen-Caffeine (GOODY HEADACHE PO) Take 1 packet by mouth 2 (two) times daily as needed (pain).    Marland Kitchen atorvastatin (LIPITOR)  10 MG tablet     . meloxicam (MOBIC) 15 MG tablet Take 1 tablet (15 mg total) by mouth daily. 30 tablet 2  . metFORMIN (GLUCOPHAGE) 500 MG tablet     . ONE TOUCH ULTRA TEST test strip     . ONETOUCH DELICA LANCETS FINE MISC     . ranitidine (ZANTAC) 150 MG tablet Take 150 mg by mouth 2 (two) times daily.     No current facility-administered medications for this visit.     REVIEW OF SYSTEMS:   [X]  denotes positive finding, [ ]  denotes negative finding Cardiac  Comments:  Chest pain or chest pressure:    Shortness of breath upon exertion:    Short of breath when lying flat:    Irregular heart rhythm:        Vascular    Pain in calf, thigh, or hip brought on by ambulation:    Pain in feet at night that wakes you up from your sleep:     Blood clot in your veins:    Leg swelling:         Pulmonary    Oxygen at home:    Productive cough:     Wheezing:         Neurologic    Sudden weakness in arms or legs:     Sudden numbness in  arms or legs:     Sudden onset of difficulty speaking or slurred speech:    Temporary loss of vision in one eye:     Problems with dizziness:         Gastrointestinal    Blood in stool:     Vomited blood:         Genitourinary    Burning when urinating:     Blood in urine:        Psychiatric    Major depression:         Hematologic    Bleeding problems:    Problems with blood clotting too easily:        Skin    Rashes or ulcers:        Constitutional    Fever or chills:      PHYSICAL EXAM:   Vitals:   08/19/17 1345 08/19/17 1346  BP: 136/80 (!) 149/93  Pulse: (!) 105   Resp: 18   Temp: (!) 97.5 F (36.4 C)   TempSrc: Oral   SpO2: 96%   Weight: 113 lb 9.6 oz (51.5 kg)     GENERAL: The patient is a well-nourished male, in no acute distress. The vital signs are documented above. CARDIAC: There is a regular rate and rhythm.  VASCULAR: No carotid bruits PULMONARY: Non-labored respirations MUSCULOSKELETAL: There are no major  deformities or cyanosis. NEUROLOGIC: No focal weakness or paresthesias are detected. SKIN: There are no ulcers or rashes noted. PSYCHIATRIC: The patient has a normal affect.  STUDIES:   I have ordered and reviewed his ultrasound study which shows a progression of the left-sided stenosis now measuring greater than 80%.  The right side remains stable at 60-79%  MEDICAL ISSUES:   High-grade left carotid stenosis, asymptomatic: I discussed the treatment options of stenting versus endarterectomy.  We have agreed to proceed with a left carotid endarterectomy.  I discussed the risks and benefits of the operation including the risk of nerve injury stroke, cardiopulmonary complications.  All of his questions were answered.  I will send him for a Myoview prior to his operation which has been scheduled for Friday, December 28    [name], MD [facility] [phone] Pager [phone]

## 2017-08-19 NOTE — Progress Notes (Signed)
[facility]ist of [facility]  Patient name: Jose Welch MRN: [id number] DOB: [birth year] Sex: male   REASON FOR VISIT:    Follow-up carotid disease  HISOTRY OF PRESENT ILLNESS:    Jose Welch is a 61 y.o. male who is back today for six-month follow-up of his carotid disease.  I initially saw him in June 2018.  At that time he had a transient episode of vision loss in his left eye.  He describes this is happening for about 30 seconds while he bent down on a very hot day.  He was seen by ophthalmology who did not find any abnormalities.  A carotid duplex showed 40-59% left carotid stenosis and 60-79% right carotid stenosis.  The patient's episode was not felt to be amaurosis, and in addition the stenosis was not that significant on the left side and so medical therapy was recommended.  He is back today for a repeat ultrasound.  He has not had any interval symptoms.  The patient is a current everyday smoker.  He has a history of alcohol abuse.  He takes [place] powders every day.  He suffers from reflux disease.  PAST MEDICAL HISTORY:   Past Medical History:  Diagnosis Date  . Anxiety   . Carotid artery occlusion   . GERD (gastroesophageal reflux disease)      FAMILY HISTORY:   No family history on file.  SOCIAL HISTORY:   Social History   Tobacco Use  . Smoking status: Current Every Day Smoker    Packs/day: 1.00    Types: Cigarettes  . Smokeless tobacco: Never Used  Substance Use Topics  . Alcohol use: Yes    Alcohol/week: 0.0 oz    Comment: occasional     ALLERGIES:   No Known Allergies   CURRENT MEDICATIONS:   Current Outpatient Medications  Medication Sig Dispense Refill  . ALPRAZolam (XANAX) 0.25 MG tablet Take 0.25 mg by mouth at bedtime as needed for anxiety.    . Aspirin-Acetaminophen-Caffeine (GOODY HEADACHE PO) Take 1 packet by mouth 2 (two) times daily as needed (pain).    Marland Kitchen atorvastatin (LIPITOR)  10 MG tablet     . meloxicam (MOBIC) 15 MG tablet Take 1 tablet (15 mg total) by mouth daily. 30 tablet 2  . metFORMIN (GLUCOPHAGE) 500 MG tablet     . ONE TOUCH ULTRA TEST test strip     . ONETOUCH DELICA LANCETS FINE MISC     . ranitidine (ZANTAC) 150 MG tablet Take 150 mg by mouth 2 (two) times daily.     No current facility-administered medications for this visit.     REVIEW OF SYSTEMS:   [X]  denotes positive finding, [ ]  denotes negative finding Cardiac  Comments:  Chest pain or chest pressure:    Shortness of breath upon exertion:    Short of breath when lying flat:    Irregular heart rhythm:        Vascular    Pain in calf, thigh, or hip brought on by ambulation:    Pain in feet at night that wakes you up from your sleep:     Blood clot in your veins:    Leg swelling:         Pulmonary    Oxygen at home:    Productive cough:     Wheezing:         Neurologic    Sudden weakness in arms or legs:     Sudden numbness in  arms or legs:     Sudden onset of difficulty speaking or slurred speech:    Temporary loss of vision in one eye:     Problems with dizziness:         Gastrointestinal    Blood in stool:     Vomited blood:         Genitourinary    Burning when urinating:     Blood in urine:        Psychiatric    Major depression:         Hematologic    Bleeding problems:    Problems with blood clotting too easily:        Skin    Rashes or ulcers:        Constitutional    Fever or chills:      PHYSICAL EXAM:   Vitals:   08/19/17 1345 08/19/17 1346  BP: 136/80 (!) 149/93  Pulse: (!) 105   Resp: 18   Temp: (!) 97.5 F (36.4 C)   TempSrc: Oral   SpO2: 96%   Weight: 113 lb 9.6 oz (51.5 kg)     GENERAL: The patient is a well-nourished male, in no acute distress. The vital signs are documented above. CARDIAC: There is a regular rate and rhythm.  VASCULAR: No carotid bruits PULMONARY: Non-labored respirations MUSCULOSKELETAL: There are no major  deformities or cyanosis. NEUROLOGIC: No focal weakness or paresthesias are detected. SKIN: There are no ulcers or rashes noted. PSYCHIATRIC: The patient has a normal affect.  STUDIES:   I have ordered and reviewed his ultrasound study which shows a progression of the left-sided stenosis now measuring greater than 80%.  The right side remains stable at 60-79%  MEDICAL ISSUES:   High-grade left carotid stenosis, asymptomatic: I discussed the treatment options of stenting versus endarterectomy.  We have agreed to proceed with a left carotid endarterectomy.  I discussed the risks and benefits of the operation including the risk of nerve injury stroke, cardiopulmonary complications.  All of his questions were answered.  I will send him for a Myoview prior to his operation which has been scheduled for Friday, December 28    [name], MD [facility] [phone] Pager [phone]

## 2017-08-20 ENCOUNTER — Other Ambulatory Visit: Payer: Self-pay | Admitting: *Deleted

## 2017-08-20 ENCOUNTER — Telehealth (HOSPITAL_COMMUNITY): Payer: Self-pay | Admitting: *Deleted

## 2017-08-20 NOTE — Telephone Encounter (Signed)
Left message on voicemail in reference to upcoming appointment scheduled for 08/23/17. Phone number given for a call back so details instructions can be given. Mysty Kielty, Ranae Palms

## 2017-08-20 NOTE — Telephone Encounter (Signed)
Patient given detailed instructions per Myocardial Perfusion Study Information Sheet for the test on 08/23/17 at 9:30. Patient notified to arrive 15 minutes early and that it is imperative to arrive on time for appointment to keep from having the test rescheduled.  If you need to cancel or reschedule your appointment, please call the office within 24 hours of your appointment. . Patient verbalized understanding.Jose Welch

## 2017-08-21 NOTE — Pre-Procedure Instructions (Signed)
Jose Welch  08/21/2017      [place], [place] - [address] Jose [address] [place] [address] Phone: [phone] Fax: [phone]    Your procedure is scheduled on Friday, August 30, 2017  Report to [facility] Admitting Entrance "A" at 10:20AM  Call this number if you have problems the morning of surgery:  [phone]   Remember:  Do not eat food or drink liquids after midnight.  Take these medicines the morning of surgery with A SIP OF WATER: If needed Ranitidine (ZANTAC) for acid reflux.  Follow your doctor's instruction regarding Aspirin.  7 days before surgery (Dec. 21), stop taking all Aspirins, Vitamins, Fish oils, and Herbal medications. Also stop all NSAIDS i.e. Advil, Ibuprofen, Motrin, Aleve, Anaprox, Naproxen, BC and Goody Powders.  How to Manage Your Diabetes Before and After Surgery  Why is it important to control my blood sugar before and after surgery? . Improving blood sugar levels before and after surgery helps healing and can limit problems. . A way of improving blood sugar control is eating a healthy diet by: o  Eating less sugar and carbohydrates o  Increasing activity/exercise o  Talking with your doctor about reaching your blood sugar goals . High blood sugars (greater than 180 mg/dL) can raise your risk of infections and slow your recovery, so you will need to focus on controlling your diabetes during the weeks before surgery. . Make sure that the doctor who takes care of your diabetes knows about your planned surgery including the date and location.  How do I manage my blood sugar before surgery? . Check your blood sugar at least 4 times a day, starting 2 days before surgery, to make sure that the level is not too high or low. o Check your blood sugar the morning of your surgery when you wake up and every 2 hours until you get to the Short Stay unit. . If your blood sugar is less  than 70 mg/dL, you will need to treat for low blood sugar: o Do not take insulin. o Treat a low blood sugar (less than 70 mg/dL) with  cup of clear juice (cranberry or apple), 4 glucose tablets, OR glucose gel. Recheck blood sugar in 15 minutes after treatment (to make sure it is greater than 70 mg/dL). If your blood sugar is not greater than 70 mg/dL on recheck, call [phone] o  for further instructions. . Report your blood sugar to the short stay nurse when you get to Short Stay.  . If you are admitted to the hospital after surgery: o Your blood sugar will be checked by the staff and you will probably be given insulin after surgery (instead of oral diabetes medicines) to make sure you have good blood sugar levels. o The goal for blood sugar control after surgery is 80-180 mg/dL.  WHAT DO I DO ABOUT MY DIABETES MEDICATION?  Marland Kitchen Do not take MetFORMIN (GLUCOPHAGE) the morning of surgery.  . If your CBG is greater than 220 mg/dL, call us at [phone]   Do not wear jewelry.  Do not wear lotions, powders, colognes, or deodorant.  Do not shave 48 hours prior to surgery.  Men may shave face and neck.  Do not bring valuables to the hospital.  [facility] is not responsible for any belongings or valuables.  Contacts, dentures or bridgework may not be worn into surgery.  Leave your suitcase in the car.  After surgery it may be brought to your room.  For patients admitted to the hospital, discharge time will be determined by your treatment team.  Patients discharged the day of surgery will not be allowed to drive home.   Special instructions:   [facility]- Preparing For Surgery  Before surgery, you can play an important role. Because skin is not sterile, your skin needs to be as free of germs as possible. You can reduce the number of germs on your skin by washing with CHG (chlorahexidine gluconate) Soap before surgery.  CHG is an antiseptic cleaner which kills germs and bonds with the  skin to continue killing germs even after washing.  Please do not use if you have an allergy to CHG or antibacterial soaps. If your skin becomes reddened/irritated stop using the CHG.  Do not shave (including legs and underarms) for at least 48 hours prior to first CHG shower. It is OK to shave your face.  Please follow these instructions carefully.   1. Shower the NIGHT BEFORE SURGERY and the MORNING OF SURGERY with CHG.   2. If you chose to wash your hair, wash your hair first as usual with your normal shampoo.  3. After you shampoo, rinse your hair and body thoroughly to remove the shampoo.  4. Use CHG as you would any other liquid soap. You can apply CHG directly to the skin and wash gently with a scrungie or a clean washcloth.   5. Apply the CHG Soap to your body ONLY FROM THE NECK DOWN.  Do not use on open wounds or open sores. Avoid contact with your eyes, ears, mouth and genitals (private parts). Wash Face and genitals (private parts)  with your normal soap.  6. Wash thoroughly, paying special attention to the area where your surgery will be performed.  7. Thoroughly rinse your body with warm water from the neck down.  8. DO NOT shower/wash with your normal soap after using and rinsing off the CHG Soap.  9. Pat yourself dry with a CLEAN TOWEL.  10. Wear CLEAN PAJAMAS to bed the night before surgery, wear comfortable clothes the morning of surgery  11. Place CLEAN SHEETS on your bed the night of your first shower and DO NOT SLEEP WITH PETS.  Day of Surgery: Do not apply any deodorants/lotions. Please wear clean clothes to the hospital/surgery center.    Please read over the following fact sheets that you were given. Pain Booklet, Coughing and Deep Breathing, Blood Transfusion Information, MRSA Information and Surgical Site Infection Prevention

## 2017-08-22 ENCOUNTER — Encounter (HOSPITAL_COMMUNITY): Payer: Self-pay

## 2017-08-22 ENCOUNTER — Encounter (HOSPITAL_COMMUNITY)
Admission: RE | Admit: 2017-08-22 | Discharge: 2017-08-22 | Disposition: A | Discharge status: Home / Self Care | Payer: BLUE CROSS/BLUE SHIELD | Source: Ambulatory Visit | Attending: Surgery | Admitting: Surgery

## 2017-08-22 ENCOUNTER — Other Ambulatory Visit: Payer: Self-pay

## 2017-08-22 DIAGNOSIS — Z7984 Long term (current) use of oral hypoglycemic drugs: Secondary | ICD-10-CM | POA: Diagnosis not present

## 2017-08-22 DIAGNOSIS — R9431 Abnormal electrocardiogram [ECG] [EKG]: Secondary | ICD-10-CM | POA: Insufficient documentation

## 2017-08-22 DIAGNOSIS — Z0183 Encounter for blood typing: Secondary | ICD-10-CM | POA: Diagnosis not present

## 2017-08-22 DIAGNOSIS — F17200 Nicotine dependence, unspecified, uncomplicated: Secondary | ICD-10-CM | POA: Insufficient documentation

## 2017-08-22 DIAGNOSIS — J449 Chronic obstructive pulmonary disease, unspecified: Secondary | ICD-10-CM | POA: Diagnosis not present

## 2017-08-22 DIAGNOSIS — E119 Type 2 diabetes mellitus without complications: Secondary | ICD-10-CM | POA: Diagnosis not present

## 2017-08-22 DIAGNOSIS — I6523 Occlusion and stenosis of bilateral carotid arteries: Secondary | ICD-10-CM | POA: Diagnosis not present

## 2017-08-22 DIAGNOSIS — Z7982 Long term (current) use of aspirin: Secondary | ICD-10-CM | POA: Insufficient documentation

## 2017-08-22 DIAGNOSIS — K219 Gastro-esophageal reflux disease without esophagitis: Secondary | ICD-10-CM | POA: Diagnosis not present

## 2017-08-22 DIAGNOSIS — Z01818 Encounter for other preprocedural examination: Secondary | ICD-10-CM | POA: Diagnosis not present

## 2017-08-22 DIAGNOSIS — Z79899 Other long term (current) drug therapy: Secondary | ICD-10-CM | POA: Diagnosis not present

## 2017-08-22 DIAGNOSIS — Z01812 Encounter for preprocedural laboratory examination: Secondary | ICD-10-CM | POA: Insufficient documentation

## 2017-08-22 HISTORY — DX: Chronic obstructive pulmonary disease, unspecified: J44.9

## 2017-08-22 HISTORY — DX: Occlusion and stenosis of left carotid artery: I65.22

## 2017-08-22 HISTORY — DX: Type 2 diabetes mellitus without complications: E11.9

## 2017-08-22 HISTORY — DX: Unspecified osteoarthritis, unspecified site: M19.90

## 2017-08-22 HISTORY — DX: Personal history of other diseases of the digestive system: Z87.19

## 2017-08-22 LAB — CBC
HEMATOCRIT: 36.5 % — AB (ref 39.0–52.0)
HEMOGLOBIN: 11.5 g/dL — AB (ref 13.0–17.0)
MCH: 25.3 pg — AB (ref 26.0–34.0)
MCHC: 31.5 g/dL (ref 30.0–36.0)
MCV: 80.4 fL (ref 78.0–100.0)
PLATELETS: 294 10*3/uL (ref 150–400)
RBC: 4.54 MIL/uL (ref 4.22–5.81)
RDW: 15.8 % — ABNORMAL HIGH (ref 11.5–15.5)
WBC: 7.5 10*3/uL (ref 4.0–10.5)

## 2017-08-22 LAB — TYPE AND SCREEN
ABO/RH(D): O POS
Antibody Screen: NEGATIVE

## 2017-08-22 LAB — SURGICAL PCR SCREEN
MRSA, PCR: NEGATIVE
Staphylococcus aureus: NEGATIVE

## 2017-08-22 LAB — HEMOGLOBIN A1C
HEMOGLOBIN A1C: 6.4 % — AB (ref 4.8–5.6)
MEAN PLASMA GLUCOSE: 136.98 mg/dL

## 2017-08-22 LAB — URINALYSIS, ROUTINE W REFLEX MICROSCOPIC
Bilirubin Urine: NEGATIVE
Glucose, UA: NEGATIVE mg/dL
Hgb urine dipstick: NEGATIVE
Ketones, ur: NEGATIVE mg/dL
Leukocytes, UA: NEGATIVE
Nitrite: NEGATIVE
Protein, ur: NEGATIVE mg/dL
Specific Gravity, Urine: 1.014 (ref 1.005–1.030)
pH: 6 (ref 5.0–8.0)

## 2017-08-22 LAB — COMPREHENSIVE METABOLIC PANEL
ALK PHOS: 83 U/L (ref 38–126)
ALT: 11 U/L — AB (ref 17–63)
AST: 15 U/L (ref 15–41)
Albumin: 3.9 g/dL (ref 3.5–5.0)
Anion gap: 9 (ref 5–15)
BUN: 9 mg/dL (ref 6–20)
CALCIUM: 9.3 mg/dL (ref 8.9–10.3)
CHLORIDE: 97 mmol/L — AB (ref 101–111)
CO2: 25 mmol/L (ref 22–32)
CREATININE: 0.76 mg/dL (ref 0.61–1.24)
GFR calc Af Amer: >60 mL/min (ref >60)
Glucose, Bld: 102 mg/dL — ABNORMAL HIGH (ref 65–99)
Potassium: 4 mmol/L (ref 3.5–5.1)
Sodium: 131 mmol/L — ABNORMAL LOW (ref 135–145)
Total Bilirubin: 0.3 mg/dL (ref 0.3–1.2)
Total Protein: 7.6 g/dL (ref 6.5–8.1)

## 2017-08-22 LAB — PROTIME-INR
INR: 1.05
Prothrombin Time: 13.6 s (ref 11.4–15.2)

## 2017-08-22 LAB — APTT: aPTT: 37 seconds — ABNORMAL HIGH (ref 24–36)

## 2017-08-22 LAB — ABO/RH: ABO/RH(D): O POS

## 2017-08-22 LAB — GLUCOSE, CAPILLARY: Glucose-Capillary: 98 mg/dL (ref 65–99)

## 2017-08-22 NOTE — Progress Notes (Signed)
PCP - Dr. Aleen Campi  Cardiologist - Denies  Chest x-ray - Denies  EKG - 08/22/17  Stress Test - 08/23/17  ECHO - Denies  Cardiac Cath - Denies  Sleep Study - No CPAP - None  LABS- 08/22/17: CBC, CMP, PT, PTT, HA1C, T/S, UA  HA1C- 08/22/17 Fasting Blood Sugar - 111, Today 98 Checks Blood Sugar ____1_ times a day  Per, Gomez Cleverly, RN for Dr. Trula Slade, the pt is continue taking Aspirin 81mg . Pt also sts he is starting Chantix today, and he ws told he can take the DOS.  Anesthesia- Yes  Pt denies having chest pain, sob, or fever at this time. All instructions explained to the pt, with a verbal understanding of the material. Pt agrees to go over the instructions while at home for a better understanding. The opportunity to ask questions was provided.

## 2017-08-23 ENCOUNTER — Ambulatory Visit (HOSPITAL_BASED_OUTPATIENT_CLINIC_OR_DEPARTMENT_OTHER): Payer: BLUE CROSS/BLUE SHIELD

## 2017-08-23 DIAGNOSIS — F17200 Nicotine dependence, unspecified, uncomplicated: Secondary | ICD-10-CM | POA: Diagnosis not present

## 2017-08-23 DIAGNOSIS — Z7982 Long term (current) use of aspirin: Secondary | ICD-10-CM | POA: Diagnosis not present

## 2017-08-23 DIAGNOSIS — I6523 Occlusion and stenosis of bilateral carotid arteries: Secondary | ICD-10-CM | POA: Diagnosis not present

## 2017-08-23 DIAGNOSIS — E119 Type 2 diabetes mellitus without complications: Secondary | ICD-10-CM | POA: Diagnosis not present

## 2017-08-23 DIAGNOSIS — J449 Chronic obstructive pulmonary disease, unspecified: Secondary | ICD-10-CM | POA: Diagnosis not present

## 2017-08-23 DIAGNOSIS — Z01812 Encounter for preprocedural laboratory examination: Secondary | ICD-10-CM | POA: Diagnosis not present

## 2017-08-23 DIAGNOSIS — K219 Gastro-esophageal reflux disease without esophagitis: Secondary | ICD-10-CM | POA: Diagnosis not present

## 2017-08-23 DIAGNOSIS — Z01810 Encounter for preprocedural cardiovascular examination: Secondary | ICD-10-CM

## 2017-08-23 DIAGNOSIS — R9431 Abnormal electrocardiogram [ECG] [EKG]: Secondary | ICD-10-CM | POA: Diagnosis not present

## 2017-08-23 DIAGNOSIS — Z79899 Other long term (current) drug therapy: Secondary | ICD-10-CM | POA: Diagnosis not present

## 2017-08-23 DIAGNOSIS — Z0183 Encounter for blood typing: Secondary | ICD-10-CM | POA: Diagnosis not present

## 2017-08-23 DIAGNOSIS — Z7984 Long term (current) use of oral hypoglycemic drugs: Secondary | ICD-10-CM | POA: Diagnosis not present

## 2017-08-23 DIAGNOSIS — Z01818 Encounter for other preprocedural examination: Secondary | ICD-10-CM | POA: Diagnosis not present

## 2017-08-23 LAB — MYOCARDIAL PERFUSION IMAGING
CHL CUP RESTING HR STRESS: 75 {beats}/min
CSEPPHR: 107 {beats}/min
LVDIAVOL: 74 mL (ref 62–150)
LVSYSVOL: 34 mL
RATE: 0.22
SDS: 3
SRS: 4
SSS: 7
TID: 0.93

## 2017-08-23 MED ORDER — TECHNETIUM TC 99M TETROFOSMIN IV KIT
31.6000 | PACK | Freq: Once | INTRAVENOUS | Status: AC | PRN
  Administered 2017-08-23: 31.6 via INTRAVENOUS
  Filled 2017-08-23: qty 32

## 2017-08-23 MED ORDER — TECHNETIUM TC 99M TETROFOSMIN IV KIT
9.3000 | PACK | Freq: Once | INTRAVENOUS | Status: AC | PRN
  Administered 2017-08-23: 9.3 via INTRAVENOUS
  Filled 2017-08-23: qty 10

## 2017-08-23 MED ORDER — REGADENOSON 0.4 MG/5ML IV SOLN
0.4000 mg | Freq: Once | INTRAVENOUS | Status: AC
  Administered 2017-08-23: 0.4 mg via INTRAVENOUS

## 2017-08-23 NOTE — Progress Notes (Addendum)
Anesthesia Chart Review:  Pt is a 61 year old male scheduled for L CEA on 08/30/2017 with Harold Barban, MD  - PCP is Wenda Low, MD  PMH includes:  DM, carotid artery occlusion, COPD, GERD.  Current smoker. BMI 17  Medications include: ASA 81 mg, Lipitor, metformin, Zantac  BP 129/84   Pulse (!) 105   Temp 36.9 C (Oral)   Resp 20   Ht 5\' 9"  (1.753 m)   Wt 114 lb 6.4 oz (51.9 kg)   SpO2 97%   BMI 16.89 kg/m   Preoperative labs reviewed.   - HbA1c 6.4, glucose 102  EKG 08/22/17: NSR. LA enlargement. Possible Right ventricular hypertrophy  Nuclear stress test 08/23/17:   Nuclear stress EF: 54%. No wall motion abnormalities  There was no ST segment deviation noted during stress.  There are no perfusion defects identified. No ischemia.  Low risk study  The study is normal.  Carotid duplex 08/19/17:  1. 60-79% R ICA stenosis. 2. 80-99% L ICA stenosis.  If no changes, I anticipate pt can proceed with surgery as scheduled.   Willeen Cass, FNP-BC [facility]/Anesthesiology Phone: [phone] 08/26/2017 9:28 AM

## 2017-08-30 ENCOUNTER — Encounter (HOSPITAL_COMMUNITY)
Admission: RE | Disposition: A | Discharge status: Home / Self Care | Payer: Self-pay | Source: Ambulatory Visit | Attending: Surgery

## 2017-08-30 ENCOUNTER — Other Ambulatory Visit: Payer: Self-pay

## 2017-08-30 ENCOUNTER — Inpatient Hospital Stay (HOSPITAL_COMMUNITY): Payer: BLUE CROSS/BLUE SHIELD | Admitting: Certified Registered"

## 2017-08-30 ENCOUNTER — Inpatient Hospital Stay (HOSPITAL_COMMUNITY): Payer: BLUE CROSS/BLUE SHIELD | Admitting: Vascular Surgery

## 2017-08-30 ENCOUNTER — Inpatient Hospital Stay (HOSPITAL_COMMUNITY)
Admission: RE | Admit: 2017-08-30 | Discharge: 2017-08-31 | DRG: 039 | Disposition: A | Discharge status: Home / Self Care | Payer: BLUE CROSS/BLUE SHIELD | Source: Ambulatory Visit | Attending: Surgery | Admitting: Surgery

## 2017-08-30 ENCOUNTER — Encounter (HOSPITAL_COMMUNITY): Payer: Self-pay

## 2017-08-30 DIAGNOSIS — F419 Anxiety disorder, unspecified: Secondary | ICD-10-CM | POA: Diagnosis not present

## 2017-08-30 DIAGNOSIS — Z23 Encounter for immunization: Secondary | ICD-10-CM | POA: Diagnosis not present

## 2017-08-30 DIAGNOSIS — I6522 Occlusion and stenosis of left carotid artery: Secondary | ICD-10-CM | POA: Diagnosis present

## 2017-08-30 DIAGNOSIS — I6523 Occlusion and stenosis of bilateral carotid arteries: Secondary | ICD-10-CM | POA: Diagnosis not present

## 2017-08-30 DIAGNOSIS — F17210 Nicotine dependence, cigarettes, uncomplicated: Secondary | ICD-10-CM | POA: Diagnosis not present

## 2017-08-30 DIAGNOSIS — K219 Gastro-esophageal reflux disease without esophagitis: Secondary | ICD-10-CM | POA: Diagnosis present

## 2017-08-30 DIAGNOSIS — Z79899 Other long term (current) drug therapy: Secondary | ICD-10-CM | POA: Diagnosis not present

## 2017-08-30 DIAGNOSIS — M1990 Unspecified osteoarthritis, unspecified site: Secondary | ICD-10-CM | POA: Diagnosis not present

## 2017-08-30 DIAGNOSIS — E119 Type 2 diabetes mellitus without complications: Secondary | ICD-10-CM | POA: Diagnosis present

## 2017-08-30 DIAGNOSIS — J449 Chronic obstructive pulmonary disease, unspecified: Secondary | ICD-10-CM | POA: Diagnosis present

## 2017-08-30 DIAGNOSIS — F1010 Alcohol abuse, uncomplicated: Secondary | ICD-10-CM | POA: Diagnosis not present

## 2017-08-30 DIAGNOSIS — I6529 Occlusion and stenosis of unspecified carotid artery: Secondary | ICD-10-CM | POA: Diagnosis present

## 2017-08-30 DIAGNOSIS — Z7984 Long term (current) use of oral hypoglycemic drugs: Secondary | ICD-10-CM | POA: Diagnosis not present

## 2017-08-30 DIAGNOSIS — I739 Peripheral vascular disease, unspecified: Secondary | ICD-10-CM | POA: Diagnosis not present

## 2017-08-30 HISTORY — PX: ENDARTERECTOMY: SHX5162

## 2017-08-30 LAB — GLUCOSE, CAPILLARY: Glucose-Capillary: 95 mg/dL (ref 65–99)

## 2017-08-30 SURGERY — ENDARTERECTOMY, CAROTID
Anesthesia: General | Site: Neck | Laterality: Left

## 2017-08-30 MED ORDER — PHENYLEPHRINE HCL 10 MG/ML IJ SOLN
INTRAMUSCULAR | Status: AC
  Filled 2017-08-30: qty 1

## 2017-08-30 MED ORDER — LIDOCAINE HCL (CARDIAC) 20 MG/ML IV SOLN
INTRAVENOUS | Status: DC | PRN
Start: 2017-08-30 — End: 2017-08-30
  Administered 2017-08-30: 40 mg via INTRAVENOUS

## 2017-08-30 MED ORDER — INSULIN ASPART 100 UNIT/ML ~~LOC~~ SOLN: 0.0000 [IU] | Freq: Three times a day (TID) | SUBCUTANEOUS | Status: DC

## 2017-08-30 MED ORDER — ROCURONIUM BROMIDE 100 MG/10ML IV SOLN
INTRAVENOUS | Status: DC | PRN
  Administered 2017-08-30: 50 mg via INTRAVENOUS

## 2017-08-30 MED ORDER — PHENYLEPHRINE HCL 10 MG/ML IJ SOLN
INTRAMUSCULAR | Status: DC | PRN
  Administered 2017-08-30: 80 ug via INTRAVENOUS

## 2017-08-30 MED ORDER — HYDRALAZINE HCL 20 MG/ML IJ SOLN: 5.0000 mg | INTRAMUSCULAR | Status: DC | PRN

## 2017-08-30 MED ORDER — HEPARIN SODIUM (PORCINE) 1000 UNIT/ML IJ SOLN
INTRAMUSCULAR | Status: DC | PRN
  Administered 2017-08-30 (×2): 2000 [IU] via INTRAVENOUS
  Administered 2017-08-30: 5000 [IU] via INTRAVENOUS

## 2017-08-30 MED ORDER — FENTANYL CITRATE (PF) 100 MCG/2ML IJ SOLN
INTRAMUSCULAR | Status: DC | PRN
  Administered 2017-08-30: 50 ug via INTRAVENOUS

## 2017-08-30 MED ORDER — METOPROLOL TARTRATE 5 MG/5ML IV SOLN
INTRAVENOUS | Status: DC | PRN
  Administered 2017-08-30: 2.5 mg via INTRAVENOUS

## 2017-08-30 MED ORDER — MAGNESIUM SULFATE 2 GM/50ML IV SOLN
2.0000 g | Freq: Every day | INTRAVENOUS | Status: DC | PRN
  Filled 2017-08-30: qty 50

## 2017-08-30 MED ORDER — CHLORHEXIDINE GLUCONATE 4 % EX LIQD: 60.0000 mL | Freq: Once | CUTANEOUS | Status: DC

## 2017-08-30 MED ORDER — SUGAMMADEX SODIUM 200 MG/2ML IV SOLN
INTRAVENOUS | Status: DC | PRN
  Administered 2017-08-30: 110 mg via INTRAVENOUS

## 2017-08-30 MED ORDER — MORPHINE SULFATE (PF) 4 MG/ML IV SOLN: 4.0000 mg | INTRAVENOUS | Status: DC | PRN

## 2017-08-30 MED ORDER — ONDANSETRON HCL 4 MG/2ML IJ SOLN: 4.0000 mg | Freq: Four times a day (QID) | INTRAMUSCULAR | Status: DC | PRN

## 2017-08-30 MED ORDER — DOCUSATE SODIUM 100 MG PO CAPS
100.0000 mg | ORAL_CAPSULE | Freq: Every day | ORAL | Status: DC
  Filled 2017-08-30: qty 1

## 2017-08-30 MED ORDER — 0.9 % SODIUM CHLORIDE (POUR BTL) OPTIME
TOPICAL | Status: DC | PRN
  Administered 2017-08-30: 3000 mL

## 2017-08-30 MED ORDER — SODIUM CHLORIDE 0.9 % IV SOLN
0.0125 ug/kg/min | INTRAVENOUS | Status: AC
  Administered 2017-08-30: .2 ug/kg/min via INTRAVENOUS
  Filled 2017-08-30 (×2): qty 2000

## 2017-08-30 MED ORDER — DEXTROSE 5 % IV SOLN
1.5000 g | Freq: Two times a day (BID) | INTRAVENOUS | Status: AC
  Administered 2017-08-30 – 2017-08-31 (×2): 1.5 g via INTRAVENOUS
  Filled 2017-08-30 (×2): qty 1.5

## 2017-08-30 MED ORDER — FAMOTIDINE 20 MG PO TABS
20.0000 mg | ORAL_TABLET | Freq: Every day | ORAL | Status: DC
  Administered 2017-08-30 – 2017-08-31 (×2): 20 mg via ORAL
  Filled 2017-08-30 (×2): qty 1

## 2017-08-30 MED ORDER — DEXTROSE 5 % IV SOLN
INTRAVENOUS | Status: DC | PRN
  Administered 2017-08-30: 50 ug/min via INTRAVENOUS

## 2017-08-30 MED ORDER — PROPOFOL 10 MG/ML IV BOLUS
INTRAVENOUS | Status: AC
  Filled 2017-08-30: qty 20

## 2017-08-30 MED ORDER — ALUM & MAG HYDROXIDE-SIMETH 200-200-20 MG/5ML PO SUSP: 15.0000 mL | ORAL | Status: DC | PRN

## 2017-08-30 MED ORDER — PROTAMINE SULFATE 10 MG/ML IV SOLN
INTRAVENOUS | Status: AC
  Filled 2017-08-30: qty 5

## 2017-08-30 MED ORDER — PANTOPRAZOLE SODIUM 40 MG PO TBEC
40.0000 mg | DELAYED_RELEASE_TABLET | Freq: Every day | ORAL | Status: DC
  Administered 2017-08-30 – 2017-08-31 (×2): 40 mg via ORAL
  Filled 2017-08-30 (×2): qty 1

## 2017-08-30 MED ORDER — METOPROLOL TARTRATE 5 MG/5ML IV SOLN: 2.0000 mg | INTRAVENOUS | Status: DC | PRN

## 2017-08-30 MED ORDER — SUGAMMADEX SODIUM 200 MG/2ML IV SOLN
INTRAVENOUS | Status: AC
  Filled 2017-08-30: qty 2

## 2017-08-30 MED ORDER — HEPARIN SODIUM (PORCINE) 1000 UNIT/ML IJ SOLN
INTRAMUSCULAR | Status: AC
  Filled 2017-08-30: qty 1

## 2017-08-30 MED ORDER — GLYCOPYRROLATE 0.2 MG/ML IJ SOLN
INTRAMUSCULAR | Status: DC | PRN
  Administered 2017-08-30: 0.2 mg via INTRAVENOUS

## 2017-08-30 MED ORDER — PHENOL 1.4 % MT LIQD: 1.0000 | OROMUCOSAL | Status: DC | PRN

## 2017-08-30 MED ORDER — METFORMIN HCL 500 MG PO TABS
500.0000 mg | ORAL_TABLET | Freq: Every day | ORAL | Status: DC
  Administered 2017-08-31: 500 mg via ORAL
  Filled 2017-08-30: qty 1

## 2017-08-30 MED ORDER — LACTATED RINGERS IV SOLN
INTRAVENOUS | Status: DC
  Administered 2017-08-30 (×2): via INTRAVENOUS

## 2017-08-30 MED ORDER — ACETAMINOPHEN 325 MG PO TABS: 325.0000 mg | ORAL_TABLET | ORAL | Status: DC | PRN

## 2017-08-30 MED ORDER — PNEUMOCOCCAL VAC POLYVALENT 25 MCG/0.5ML IJ INJ
0.5000 mL | INJECTION | INTRAMUSCULAR | Status: AC
  Administered 2017-08-31: 0.5 mL via INTRAMUSCULAR
  Filled 2017-08-30: qty 0.5

## 2017-08-30 MED ORDER — SODIUM CHLORIDE 0.9 % IV SOLN: INTRAVENOUS | Status: DC

## 2017-08-30 MED ORDER — ATORVASTATIN CALCIUM 10 MG PO TABS: 10.0000 mg | ORAL_TABLET | Freq: Every day | ORAL | Status: DC

## 2017-08-30 MED ORDER — OXYCODONE-ACETAMINOPHEN 5-325 MG PO TABS
ORAL_TABLET | ORAL | Status: AC
  Filled 2017-08-30: qty 1

## 2017-08-30 MED ORDER — PROTAMINE SULFATE 10 MG/ML IV SOLN
INTRAVENOUS | Status: DC | PRN
  Administered 2017-08-30: 50 mg via INTRAVENOUS

## 2017-08-30 MED ORDER — PROPOFOL 10 MG/ML IV BOLUS
INTRAVENOUS | Status: DC | PRN
  Administered 2017-08-30: 30 mg via INTRAVENOUS
  Administered 2017-08-30: 50 mg via INTRAVENOUS
  Administered 2017-08-30: 30 mg via INTRAVENOUS

## 2017-08-30 MED ORDER — HEPARIN SODIUM (PORCINE) 5000 UNIT/ML IJ SOLN
INTRAMUSCULAR | Status: DC | PRN
  Administered 2017-08-30: 14:00:00

## 2017-08-30 MED ORDER — GUAIFENESIN-DM 100-10 MG/5ML PO SYRP: 15.0000 mL | ORAL_SOLUTION | ORAL | Status: DC | PRN

## 2017-08-30 MED ORDER — POTASSIUM CHLORIDE CRYS ER 20 MEQ PO TBCR: 20.0000 meq | EXTENDED_RELEASE_TABLET | Freq: Every day | ORAL | Status: DC | PRN

## 2017-08-30 MED ORDER — ROCURONIUM BROMIDE 10 MG/ML (PF) SYRINGE
PREFILLED_SYRINGE | INTRAVENOUS | Status: AC
  Filled 2017-08-30: qty 5

## 2017-08-30 MED ORDER — LIDOCAINE 2% (20 MG/ML) 5 ML SYRINGE
INTRAMUSCULAR | Status: AC
  Filled 2017-08-30: qty 5

## 2017-08-30 MED ORDER — FENTANYL CITRATE (PF) 250 MCG/5ML IJ SOLN
INTRAMUSCULAR | Status: AC
  Filled 2017-08-30: qty 5

## 2017-08-30 MED ORDER — ASPIRIN EC 81 MG PO TBEC
81.0000 mg | DELAYED_RELEASE_TABLET | Freq: Every day | ORAL | Status: DC
  Administered 2017-08-30 – 2017-08-31 (×2): 81 mg via ORAL
  Filled 2017-08-30 (×2): qty 1

## 2017-08-30 MED ORDER — OXYCODONE-ACETAMINOPHEN 5-325 MG PO TABS
1.0000 | ORAL_TABLET | ORAL | Status: DC | PRN
  Administered 2017-08-30 (×2): 1 via ORAL
  Administered 2017-08-31: 2 via ORAL
  Administered 2017-08-31: 1 via ORAL
  Filled 2017-08-30: qty 2
  Filled 2017-08-30 (×3): qty 1

## 2017-08-30 MED ORDER — ONDANSETRON HCL 4 MG/2ML IJ SOLN
INTRAMUSCULAR | Status: DC | PRN
  Administered 2017-08-30: 4 mg via INTRAVENOUS

## 2017-08-30 MED ORDER — ONDANSETRON HCL 4 MG/2ML IJ SOLN
INTRAMUSCULAR | Status: AC
  Filled 2017-08-30: qty 2

## 2017-08-30 MED ORDER — DEXTROSE 5 % IV SOLN
1.5000 g | INTRAVENOUS | Status: AC
  Administered 2017-08-30: 1.5 g via INTRAVENOUS
  Filled 2017-08-30: qty 1.5

## 2017-08-30 MED ORDER — HEMOSTATIC AGENTS (NO CHARGE) OPTIME
TOPICAL | Status: DC | PRN
  Administered 2017-08-30: 1 via TOPICAL

## 2017-08-30 MED ORDER — SODIUM CHLORIDE 0.9 % IV SOLN: 500.0000 mL | Freq: Once | INTRAVENOUS | Status: DC | PRN

## 2017-08-30 MED ORDER — LABETALOL HCL 5 MG/ML IV SOLN: 10.0000 mg | INTRAVENOUS | Status: DC | PRN

## 2017-08-30 MED ORDER — ACETAMINOPHEN 650 MG RE SUPP: 325.0000 mg | RECTAL | Status: DC | PRN

## 2017-08-30 MED ORDER — LIDOCAINE HCL (PF) 1 % IJ SOLN
INTRAMUSCULAR | Status: AC
  Filled 2017-08-30: qty 30

## 2017-08-30 MED ORDER — POLYETHYLENE GLYCOL 3350 17 G PO PACK: 17.0000 g | PACK | Freq: Every day | ORAL | Status: DC | PRN

## 2017-08-30 MED ORDER — SODIUM CHLORIDE 0.9 % IV SOLN
INTRAVENOUS | Status: DC
  Administered 2017-08-30: 21:00:00 via INTRAVENOUS

## 2017-08-30 SURGICAL SUPPLY — 55 items
CANISTER SUCT 3000ML PPV (MISCELLANEOUS) ×2 IMPLANT
CATH ROBINSON RED A/P 18FR (CATHETERS) ×2 IMPLANT
CATH SUCT 10FR WHISTLE TIP (CATHETERS) ×2 IMPLANT
CLIP TI MEDIUM 6 (CLIP) ×2 IMPLANT
CLIP VESOCCLUDE MED 6/CT (CLIP) ×2 IMPLANT
CLIP VESOCCLUDE SM WIDE 6/CT (CLIP) ×2 IMPLANT
COVER PROBE W GEL 5X96 (DRAPES) ×4 IMPLANT
CRADLE DONUT ADULT HEAD (MISCELLANEOUS) ×2 IMPLANT
DERMABOND ADHESIVE PROPEN (GAUZE/BANDAGES/DRESSINGS) ×1
DERMABOND ADVANCED (GAUZE/BANDAGES/DRESSINGS) ×1
DERMABOND ADVANCED .7 DNX12 (GAUZE/BANDAGES/DRESSINGS) ×1 IMPLANT
DERMABOND ADVANCED .7 DNX6 (GAUZE/BANDAGES/DRESSINGS) ×1 IMPLANT
DRAIN CHANNEL 15F RND FF W/TCR (WOUND CARE) IMPLANT
ELECT REM PT RETURN 9FT ADLT (ELECTROSURGICAL) ×2
ELECTRODE REM PT RTRN 9FT ADLT (ELECTROSURGICAL) ×1 IMPLANT
EVACUATOR SILICONE 100CC (DRAIN) IMPLANT
GLOVE BIO SURGEON STRL SZ 6.5 (GLOVE) ×2 IMPLANT
GLOVE BIO SURGEON STRL SZ7 (GLOVE) ×2 IMPLANT
GLOVE BIOGEL PI IND STRL 6.5 (GLOVE) ×1 IMPLANT
GLOVE BIOGEL PI IND STRL 7.0 (GLOVE) ×1 IMPLANT
GLOVE BIOGEL PI IND STRL 7.5 (GLOVE) ×1 IMPLANT
GLOVE BIOGEL PI INDICATOR 6.5 (GLOVE) ×1
GLOVE BIOGEL PI INDICATOR 7.0 (GLOVE) ×1
GLOVE BIOGEL PI INDICATOR 7.5 (GLOVE) ×1
GLOVE SURG SS PI 7.5 STRL IVOR (GLOVE) ×2 IMPLANT
GOWN STRL REUS W/ TWL LRG LVL3 (GOWN DISPOSABLE) ×3 IMPLANT
GOWN STRL REUS W/ TWL XL LVL3 (GOWN DISPOSABLE) ×2 IMPLANT
GOWN STRL REUS W/TWL LRG LVL3 (GOWN DISPOSABLE) ×3
GOWN STRL REUS W/TWL XL LVL3 (GOWN DISPOSABLE) ×2
HEMOSTAT SNOW SURGICEL 2X4 (HEMOSTASIS) ×2 IMPLANT
INSERT FOGARTY SM (MISCELLANEOUS) IMPLANT
KIT BASIN OR (CUSTOM PROCEDURE TRAY) ×2 IMPLANT
KIT ROOM TURNOVER OR (KITS) ×2 IMPLANT
KIT SHUNT ARGYLE CAROTID ART 6 (VASCULAR PRODUCTS) IMPLANT
NEEDLE HYPO 25GX1X1/2 BEV (NEEDLE) IMPLANT
NS IRRIG 1000ML POUR BTL (IV SOLUTION) ×6 IMPLANT
PACK CAROTID (CUSTOM PROCEDURE TRAY) ×2 IMPLANT
PAD ARMBOARD 7.5X6 YLW CONV (MISCELLANEOUS) ×4 IMPLANT
PATCH VASC XENOSURE 1CMX6CM (Vascular Products) ×1 IMPLANT
PATCH VASC XENOSURE 1X6 (Vascular Products) ×1 IMPLANT
SHUNT CAROTID BYPASS 10 (VASCULAR PRODUCTS) IMPLANT
SHUNT CAROTID BYPASS 12FRX15.5 (VASCULAR PRODUCTS) IMPLANT
SUT ETHILON 3 0 PS 1 (SUTURE) IMPLANT
SUT PROLENE 5 0 C 1 24 (SUTURE) ×6 IMPLANT
SUT PROLENE 6 0 BV (SUTURE) ×4 IMPLANT
SUT PROLENE 7 0 BV 1 (SUTURE) IMPLANT
SUT PROLENE BLUE 7 0 (SUTURE) ×2 IMPLANT
SUT SILK 3 0 (SUTURE) ×1
SUT SILK 3-0 18XBRD TIE 12 (SUTURE) ×1 IMPLANT
SUT VIC AB 3-0 SH 27 (SUTURE) ×2
SUT VIC AB 3-0 SH 27X BRD (SUTURE) ×2 IMPLANT
SUT VICRYL 4-0 PS2 18IN ABS (SUTURE) ×2 IMPLANT
SYR CONTROL 10ML LL (SYRINGE) IMPLANT
TOWEL GREEN STERILE (TOWEL DISPOSABLE) ×2 IMPLANT
WATER STERILE IRR 1000ML POUR (IV SOLUTION) ×2 IMPLANT

## 2017-08-30 NOTE — Anesthesia Procedure Notes (Addendum)
Procedure Name: Intubation Date/Time: 08/30/2017 1:41 PM Performed by: Lance Coon, CRNA Pre-anesthesia Checklist: Patient identified, Emergency Drugs available, Suction available and Patient being monitored Patient Re-evaluated:Patient Re-evaluated prior to induction Oxygen Delivery Method: Circle system utilized Preoxygenation: Pre-oxygenation with 100% oxygen Induction Type: IV induction Ventilation: Mask ventilation without difficulty and Oral airway inserted - appropriate to patient size Laryngoscope Size: Miller and 3 Grade View: Grade I Tube type: Oral Tube size: 7.5 mm Number of attempts: 1 Airway Equipment and Method: Stylet Placement Confirmation: ETT inserted through vocal cords under direct vision,  positive ETCO2 and breath sounds checked- equal and bilateral Secured at: 21 cm Tube secured with: Tape Dental Injury: Teeth and Oropharynx as per pre-operative assessment

## 2017-08-30 NOTE — Op Note (Signed)
Patient name: Jose Welch MRN: [id number] DOB: April 06, 1956 Sex: male  08/30/2017 Pre-operative Diagnosis: ? symptomatic   left carotid stenosis Post-operative diagnosis:  Same Surgeon:  [name] Assistants:  Arlee Muslim Procedure:    left carotid Endarterectomy with bovine pericardial patch angioplasty Anesthesia:  General Blood Loss:  See anesthesia record Specimens:  none Findings:  90 %stenosis; Thrombus: none  Indications: I initially saw the patient in June 2018.  At that time he had a episode of transient visual loss in the left eye.  He had 40-59% stenosis in the left carotid artery.  It was not felt that he had amaurosis and so medical management was recommended.  He represented for follow-up in December and was found to have progression of the stenosis in the left carotid artery to greater than 80% stenosis.  We therefore elected to proceed with endarterectomy.  Procedure:  The patient was identified in the holding area and taken to [place] 16  The patient was then placed supine on the table.   General endotrachial anesthesia was administered.  The patient was prepped and draped in the usual sterile fashion.  A time out was called and antibiotics were administered.  The incision was made along the anterior border of the left sternocleidomastoid muscle.  Cautery was used to dissect through the subcutaneous tissue.  The platysma muscle was divided with cautery.  The internal jugular vein was exposed along its anterior medial border.  The common facial vein was exposed and then divided between 2-0 silk ties and metal clips.  The common carotid artery was then circumferentially exposed and encircled with an umbilical tape.  The vagus nerve was identified and protected.  Next sharp dissection was used to expose the external carotid artery and the superior thyroid artery.  The were encircled with a blue vessel loop and a 2-0 silk tie respectively.  Finally, the internal carotid was  carefully dissected free.  An umbilical tape was placed around the internal carotid artery distal to the diseased segment.  The hypoglossal nerve was visualized throughout and protected.  The patient was given systemic heparinization.  A bovine carotid patch was selected and prepared on the back table.  A 10 french shunt was also prepared.  After blood pressure readings were appropriate and the heparin had been given time to circulate, the internal carotid artery was occluded with a baby Gregory clamp.  The external and common carotid arteries were then occluded with vascular clamps and the 2-0 tie tightened on the superior thyroid artery.  A #11 blade was used to make an arteriotomy in the common carotid artery.  This was extended with Potts scissors along the anterior and lateral border of the common and internal carotid artery.  Approximately 90% stenosis was identified.  There was no thrombus identified.  The 10 french shunt was not placed, as he had pulsatile backbleeding..  A kleiner kuntz elevator was used to perform endarterectomy.  An eversion endarterectomy was performed in the external carotid artery.  I was not satisfied with the distal endpoint in the internal carotid artery and therefore since there was some redundancy, I elected to resect a portion of the internal carotid artery, closing the posterior wall with a running 7-0 Prolene.    The specimen was removed and sent to pathology.  Heparinized saline was used to irrigate the endarterectomized field.  All potential embolic debris was removed.  The disease also extended into the proximal common carotid artery and therefore  the common carotid artery was opened out the clavicle.  After the endarterectomy had been performed, I closed the common carotid artery primarily up to the level of the carotid bifurcation.Bovine pericardial patch angioplasty was then performed using a running 6-0 Prolene. Just prior to completion of the repair, the shunt was  removed. The common internal and external carotid arteries were all appropriately flushed. The artery was again irrigated with heparin saline.  The anastomosis was then secured. The clamp was first released on the external carotid artery followed by the common carotid artery approximately 30 seconds later, bloodflow was reestablish through the internal carotid artery.  Next, a hand-held  Doppler was used to evaluate the signals in the common, external, and internal  carotid arteries, all of which had appropriate signals. I then administered  50 mg protamine. The wound was then irrigated.  After hemostasis was achieved, the carotid sheath was reapproximated with 3-0 Vicryl. The  platysma muscle was reapproximated with running 3-0 Vicryl. The skin  was closed with 4-0 Vicryl. Dermabond was placed on the skin. The  patient was then successfully extubated. His neurologic exam was  similar to his preprocedural exam. The patient was then taken to recovery room  in stable condition. There were no complications.     Disposition:  To PACU in stable condition.  Relevant Operative Details: I did not place a shunt given pulsatile backbleeding.  The hypoglossal nerve had to be fully mobilized in order to get exposure to the distal internal carotid artery.  Initially, I was not satisfied with the distal endpoint, and because there was some redundancy in the internal carotid artery, I elected to transect the internal carotid artery in order to get a good distal endpoint.  I then reapproximated the posterior wall with a running 7-0 Prolene.  I then had to move the clamp very proximal down to the clavicle so that I can open the common carotid artery and remove the plaque within the common carotid artery.  I elected to close the common carotid artery primarily with 5-0 Prolene just short of the bifurcation.  I then used a bovine pericardial patch to complete patch angioplasty.  Theotis Burrow, M.D. [facility] [place] Office: [phone] Pager:  [phone]

## 2017-08-30 NOTE — Anesthesia Procedure Notes (Signed)
Arterial Line Insertion Start/End12/28/2018 12:35 PM, 08/30/2017 12:50 PM Performed by: Colin Benton, CRNA, CRNA  Patient location: Pre-op. Preanesthetic checklist: patient identified, IV checked, site marked, risks and benefits discussed, surgical consent, monitors and equipment checked, pre-op evaluation, timeout performed and anesthesia consent Lidocaine 1% used for infiltration Right, radial was placed Catheter size: 20 G Maximum sterile barriers used  and Seldinger technique used Allen's test indicative of satisfactory collateral circulation Attempts: 3 Procedure performed without using ultrasound guided technique. Following insertion, dressing applied and Biopatch. Post procedure assessment: normal and unchanged  Patient tolerated the procedure well with no immediate complications.

## 2017-08-30 NOTE — Interval H&P Note (Signed)
History and Physical Interval Note:  08/30/2017 12:50 PM  Jose Welch  has presented today for surgery, with the diagnosis of CLEFT CAROTID STENOSIS  The various methods of treatment have been discussed with the patient and family. After consideration of risks, benefits and other options for treatment, the patient has consented to  Procedure(s): ENDARTERECTOMY CAROTID LEFT (Left) as a surgical intervention .  The patient's history has been reviewed, patient examined, no change in status, stable for surgery.  I have reviewed the patient's chart and labs.  Questions were answered to the patient's satisfaction.     [name]

## 2017-08-30 NOTE — Progress Notes (Signed)
Status post left carotid endarterectomy.  The patient is neurologically intact in the recovery room.  He has a slight left marginal mandibular neurapraxia.  The incision is soft with no hematoma.  The patient will be admitted overnight.  He will likely be able to be discharged tomorrow.  He will need to go home on aspirin and statin.  [name]

## 2017-08-30 NOTE — Progress Notes (Signed)
Bladder scan x 3= volume between 427mls to 641mls/ does not want to be cath'd, Stood to void w/ standby assist x 7 minutes, passed 161mls yellow urine

## 2017-08-30 NOTE — Transfer of Care (Signed)
Immediate Anesthesia Transfer of Care Note  Patient: Jose Welch  Procedure(s) Performed: ENDARTERECTOMY CAROTID LEFT (Left Neck)  Patient Location: PACU  Anesthesia Type:General  Level of Consciousness: awake and patient cooperative  Airway & Oxygen Therapy: Patient Spontanous Breathing  Post-op Assessment: Report given to RN and Post -op Vital signs reviewed and stable  Post vital signs: Reviewed and stable  Last Vitals:  Vitals:   08/30/17 1052  BP: (!) 141/85  Pulse: 82  Resp: 18  Temp: 36.5 C  SpO2: 98%    Last Pain:  Vitals:   08/30/17 1052  TempSrc: Oral      Patients Stated Pain Goal: 3 (28/36/62 9476)  Complications: No apparent anesthesia complications

## 2017-08-31 ENCOUNTER — Other Ambulatory Visit: Payer: Self-pay

## 2017-08-31 LAB — CBC
HEMATOCRIT: 32.2 % — AB (ref 39.0–52.0)
HEMOGLOBIN: 10 g/dL — AB (ref 13.0–17.0)
MCH: 25.2 pg — ABNORMAL LOW (ref 26.0–34.0)
MCHC: 31.1 g/dL (ref 30.0–36.0)
MCV: 81.1 fL (ref 78.0–100.0)
PLATELETS: 224 10*3/uL (ref 150–400)
RBC: 3.97 MIL/uL — AB (ref 4.22–5.81)
RDW: 15.5 % (ref 11.5–15.5)
WBC: 6.5 10*3/uL (ref 4.0–10.5)

## 2017-08-31 LAB — BASIC METABOLIC PANEL
ANION GAP: 11 (ref 5–15)
BUN: 9 mg/dL (ref 6–20)
CHLORIDE: 97 mmol/L — AB (ref 101–111)
CO2: 25 mmol/L (ref 22–32)
Calcium: 8.7 mg/dL — ABNORMAL LOW (ref 8.9–10.3)
Creatinine, Ser: 0.74 mg/dL (ref 0.61–1.24)
GFR calc Af Amer: >60 mL/min (ref >60)
GLUCOSE: 89 mg/dL (ref 65–99)
POTASSIUM: 4.1 mmol/L (ref 3.5–5.1)
Sodium: 133 mmol/L — ABNORMAL LOW (ref 135–145)

## 2017-08-31 LAB — GLUCOSE, CAPILLARY: GLUCOSE-CAPILLARY: 103 mg/dL — AB (ref 65–99)

## 2017-08-31 LAB — POCT ACTIVATED CLOTTING TIME: ACTIVATED CLOTTING TIME: 208 s

## 2017-08-31 MED ORDER — OXYCODONE-ACETAMINOPHEN 5-325 MG PO TABS: 1.0000 | ORAL_TABLET | Freq: Four times a day (QID) | ORAL | 0 refills | Status: DC | PRN

## 2017-08-31 NOTE — Anesthesia Postprocedure Evaluation (Signed)
Anesthesia Post Note  Patient: Jose Welch  Procedure(s) Performed: ENDARTERECTOMY CAROTID LEFT (Left Neck)     Patient location during evaluation: PACU Anesthesia Type: General Level of consciousness: awake and alert Pain management: pain level controlled Vital Signs Assessment: post-procedure vital signs reviewed and stable Respiratory status: spontaneous breathing, nonlabored ventilation, respiratory function stable and patient connected to nasal cannula oxygen Cardiovascular status: blood pressure returned to baseline and stable Postop Assessment: no apparent nausea or vomiting Anesthetic complications: no    Last Vitals:  Vitals:   08/31/17 0600 08/31/17 0800  BP:  (!) 101/58  Pulse:    Resp:  (!) 21  Temp:  (!) 36.4 C  SpO2: 97% 90%    Last Pain:  Vitals:   08/31/17 0800  TempSrc: Oral  PainSc:                  Tressy Kunzman

## 2017-08-31 NOTE — Anesthesia Preprocedure Evaluation (Signed)
Anesthesia Evaluation  Patient identified by MRN, date of birth, ID band Patient awake    Reviewed: Allergy & Precautions, NPO status , Patient's Chart, lab work & pertinent test results  History of Anesthesia Complications Negative for: history of anesthetic complications  Airway Mallampati: II  TM Distance: >3 FB Neck ROM: full    Dental  (+) Dental Advisory Given   Pulmonary COPD, Current Smoker,    breath sounds clear to auscultation       Cardiovascular + Peripheral Vascular Disease   Rhythm:regular Rate:Normal     Neuro/Psych PSYCHIATRIC DISORDERS Anxiety negative neurological ROS     GI/Hepatic Neg liver ROS, hiatal hernia, GERD  ,  Endo/Other  diabetes  Renal/GU negative Renal ROS     Musculoskeletal  (+) Arthritis ,   Abdominal   Peds  Hematology negative hematology ROS (+)   Anesthesia Other Findings   Reproductive/Obstetrics                             Anesthesia Physical Anesthesia Plan  ASA: III  Anesthesia Plan: General   Post-op Pain Management:    Induction: Intravenous  PONV Risk Score and Plan: 1 and Ondansetron  Airway Management Planned: Oral ETT  Additional Equipment: Arterial line  Intra-op Plan:   Post-operative Plan: Extubation in OR  Informed Consent: I have reviewed the patients History and Physical, chart, labs and discussed the procedure including the risks, benefits and alternatives for the proposed anesthesia with the patient or authorized representative who has indicated his/her understanding and acceptance.   Dental advisory given  Plan Discussed with: CRNA and Surgeon  Anesthesia Plan Comments:         Anesthesia Quick Evaluation

## 2017-08-31 NOTE — Progress Notes (Addendum)
  Progress Note    08/31/2017 7:50 AM 1 Day Post-Op  Subjective:  C/o his back hurting; denies trouble swallowing  Afebrile HR  70's-100 NSR 99'M-426'S systolic 34% RA  Vitals:   08/31/17 0346 08/31/17 0600  BP: 105/65   Pulse: 84   Resp: 14   Temp: 98.3 F (36.8 C)   SpO2: 96% 97%     Physical Exam: Neuro:  In tact; moving all extremities equally; tongue is midline Lungs:  Non labored Incision:  Clean and dry without hematoma  CBC    Component Value Date/Time   WBC 6.5 08/31/2017 0316   RBC 3.97 (L) 08/31/2017 0316   HGB 10.0 (L) 08/31/2017 0316   HCT 32.2 (L) 08/31/2017 0316   PLT 224 08/31/2017 0316   MCV 81.1 08/31/2017 0316   MCH 25.2 (L) 08/31/2017 0316   MCHC 31.1 08/31/2017 0316   RDW 15.5 08/31/2017 0316    BMET    Component Value Date/Time   NA 133 (L) 08/31/2017 0316   K 4.1 08/31/2017 0316   CL 97 (L) 08/31/2017 0316   CO2 25 08/31/2017 0316   GLUCOSE 89 08/31/2017 0316   BUN 9 08/31/2017 0316   CREATININE 0.74 08/31/2017 0316   CALCIUM 8.7 (L) 08/31/2017 0316   GFRNONAA >60 08/31/2017 0316   GFRAA >60 08/31/2017 0316     Intake/Output Summary (Last 24 hours) at 08/31/2017 0750 Last data filed at 08/31/2017 0300 Gross per 24 hour  Intake 2676.67 ml  Output 575 ml  Net 2101.67 ml     Assessment/Plan:  This is a 61 y.o. male who is s/p left CEA 1 Day Post-Op  -pt is doing well this am. -pt neuro exam is in tact -pt has not ambulated-needs to be OOB and ambulate  -pt has voided -f/u with Dr. Trula Slade in 2 weeks.   Leontine Locket, PA-C [facility] [phone]  Addendum  I have independently interviewed and examined the patient, and I agree with the physician assistant's findings.  Neuro intact.  No hematoma.  HD ok.  Ok to D/C  Adele Barthel, MD, [facility] [place] Office: [phone] Pager: [phone]  08/31/2017, 9:39 AM

## 2017-08-31 NOTE — Progress Notes (Signed)
Discharge instructions given to patient, including when to call the doctor and follow-up appointments. IV removed, clean and intact. Telemetry removed.  Clyde Canterbury, RN

## 2017-08-31 NOTE — Discharge Summary (Signed)
Discharge Summary     Jose Welch [birth year] 61 y.o. male  [id number]  Admission Date: 08/30/2017  Discharge Date: 08/31/17  Physician: Serafina Mitchell, MD  Admission Diagnosis: Left carotid artery stenosis  HPI:   This is a 61 y.o. male male who is back today for six-month follow-up of his carotid disease.  I initially saw him in June 2018.  At that time he had a transient episode of vision loss in his left eye.  He describes this is happening for about 30 seconds while he bent down on a very hot day.  He was seen by ophthalmology who did not find any abnormalities.  A carotid duplex showed 40-59% left carotid stenosis and 60-79% right carotid stenosis.  The patient's episode was not felt to be amaurosis, and in addition the stenosis was not that significant on the left side and so medical therapy was recommended.  He is back today for a repeat ultrasound.  He has not had any interval symptoms.  The patient is a current everyday smoker. He has a history of alcohol abuse. He takes [place] powders every day. He suffers from reflux disease.   Hospital Course:  The patient was admitted to the hospital and taken to the operating room on 08/30/2017 and underwent left carotid endarterectomy.    male who is back today for six-month follow-up of his carotid disease.  I initially saw him in June 2018.  At that time he had a transient episode of vision loss in his left eye.  He describes this is happening for about 30 seconds while he bent down on a very hot day.  He was seen by ophthalmology who did not find any abnormalities.  A carotid duplex showed 40-59% left carotid stenosis and 60-79% right carotid stenosis.  The patient's episode was not felt to be amaurosis, and in addition the stenosis was not that significant on the left side and so medical therapy was recommended.  He is back today for a repeat ultrasound.  He has not had any interval symptoms.  The patient is a current  everyday smoker. He has a history of alcohol abuse. He takes [place] powders every day. He suffers from reflux disease.  The pt tolerated the procedure well and was transported to the PACU in good condition.   By POD 1, the pt neuro status was in tact.  He denied difficulty swallowing.  He ambulated well.  No hematoma.  He is discharged home.  The remainder of the hospital course consisted of increasing mobilization and increasing intake of solids without difficulty.   Recent Labs    08/31/17 0316  NA 133*  K 4.1  CL 97*  CO2 25  GLUCOSE 89  BUN 9  CALCIUM 8.7*   Recent Labs    08/31/17 0316  WBC 6.5  HGB 10.0*  HCT 32.2*  PLT 224   No results for input(s): INR in the last 72 hours.     Discharge Diagnosis:  CLEFT CAROTID STENOSIS  Secondary Diagnosis: Patient Active Problem List   Diagnosis Date Noted  . Carotid artery stenosis 08/30/2017   Past Medical History:  Diagnosis Date  . Anxiety   . Arthritis    Right wrist  . Carotid artery occlusion   . Carotid stenosis, left   . COPD (chronic obstructive pulmonary disease) ([place])   . Diabetes mellitus without complication ([place])    Type II  . GERD (gastroesophageal reflux disease)   . History of hiatal  hernia     Allergies as of 08/31/2017   No Known Allergies     Medication List    STOP taking these medications   GOODY HEADACHE PO     TAKE these medications   aspirin EC 81 MG tablet Take 81 mg by mouth daily.   atorvastatin 10 MG tablet Commonly known as:  LIPITOR Take 10 mg by mouth daily at 6 PM.   meloxicam 15 MG tablet Commonly known as:  MOBIC Take 1 tablet (15 mg total) by mouth daily.   metFORMIN 500 MG tablet Commonly known as:  GLUCOPHAGE Take 500 mg by mouth daily with breakfast.   ONE TOUCH ULTRA TEST test strip Generic drug:  glucose blood   ONETOUCH DELICA LANCETS FINE Misc   oxyCODONE-acetaminophen 5-325 MG tablet Commonly known as:  ROXICET Take 1 tablet by mouth  every 6 (six) hours as needed for severe pain.   ranitidine 150 MG tablet Commonly known as:  ZANTAC Take 150 mg by mouth daily as needed for heartburn.        Discharge Instructions: .sjrdc  [facility] Discharge Instructions Carotid Endarterectomy (CEA)  Please refer to the following instructions for your post-procedure care. Your surgeon or physician assistant will discuss any changes with you.  Activity  You are encouraged to walk as much as you can. You can slowly return to normal activities but must avoid strenuous activity and heavy lifting until your doctor tell you it's OK. Avoid activities such as vacuuming or swinging a golf club. You can drive after one week if you are comfortable and you are no longer taking prescription pain medications. It is normal to feel tired for serval weeks after your surgery. It is also normal to have difficulty with sleep habits, eating, and bowel movements after surgery. These will go away with time.  Bathing/Showering  You may shower after you come home. Do not soak in a bathtub, hot tub, or swim until the incision heals completely.  Incision Care  Shower every day. Clean your incision with mild soap and water. Pat the area dry with a clean towel. You do not need a bandage unless otherwise instructed. Do not apply any ointments or creams to your incision. You may have skin glue on your incision. Do not peel it off. It will come off on its own in about one week. Your incision may feel thickened and raised for several weeks after your surgery. This is normal and the skin will soften over time. For Men Only: It's OK to shave around the incision but do not shave the incision itself for 2 weeks. It is common to have numbness under your chin that could last for several months.  Diet  Resume your normal diet. There are no special food restrictions following this procedure. A low fat/low cholesterol diet is recommended  for all patients with vascular disease. In order to heal from your surgery, it is CRITICAL to get adequate nutrition. Your body requires vitamins, minerals, and protein. Vegetables are the best source of vitamins and minerals. Vegetables also provide the perfect balance of protein. Processed food has little nutritional value, so try to avoid this.  Medications  Resume taking all of your medications unless your doctor or physician assistant tells you not to.  If your incision is causing pain, you may take over-the- counter pain relievers such as acetaminophen (Tylenol). If you were prescribed a stronger pain medication, please be aware these medications can cause  nausea and constipation.  Prevent nausea by taking the medication with a snack or meal. Avoid constipation by drinking plenty of fluids and eating foods with a high amount of fiber, such as fruits, vegetables, and grains. Do not take Tylenol if you are taking prescription pain medications.  Follow Up  Our office will schedule a follow up appointment 2-3 weeks following discharge.  Please call us immediately for any of the following conditions  Increased pain, redness, drainage (pus) from your incision site. Fever of 101 degrees or higher. If you should develop stroke (slurred speech, difficulty swallowing, weakness on one side of your body, loss of vision) you should call 911 and go to the nearest emergency room.  Reduce your risk of vascular disease:  Stop smoking. If you would like help call [facility] at 1-800-QUIT-NOW 330-590-3689) or [place] at (228)252-6791. Manage your cholesterol Maintain a desired weight Control your diabetes Keep your blood pressure down  If you have any questions, please call the office at [phone].  Prescriptions given: Roxicet #8 No Refill (sent electronically)  Disposition: home  Patient's condition: is Good  Follow up: 1. Dr. Trula Slade in 2 weeks.   Leontine Locket, PA-C [facility] [phone]   --- For [facility] use ---   Modified Rankin score at D/C (0-6): 0  IV medication needed for:  1. Hypertension: No 2. Hypotension: No  Post-op Complications: No  1. Post-op CVA or TIA: No  If yes: Event classification (right eye, left eye, right cortical, left cortical, verterobasilar, other): n/a  If yes: Timing of event (intra-op, <6 hrs post-op, >=6 hrs post-op, unknown): n/a  2. CN injury: No  If yes: CN n/a injuried   3. Myocardial infarction: No  If yes: Dx by (EKG or clinical, Troponin): n/a  4.  CHF: No  5.  Dysrhythmia (new): No  6. Wound infection: No  7. Reperfusion symptoms: No  8. Return to OR: No  If yes: return to OR for (bleeding, neurologic, other CEA incision, other): n/a  Discharge medications: Statin use:  Yes ASA use:  Yes   Beta blocker use:  No ACE-Inhibitor use:  No  ARB use:  No CCB use: No P2Y12 Antagonist use: No, [ ]  Plavix, [ ]  Plasugrel, [ ]  Ticlopinine, [ ]  Ticagrelor, [ ]  Other, [ ]  No for medical reason, [ ]  Non-compliant, [ ]  Not-indicated Anti-coagulant use:  No, [ ]  Warfarin, [ ]  Rivaroxaban, [ ]  Dabigatran,

## 2017-09-02 ENCOUNTER — Telehealth: Payer: Self-pay | Admitting: Surgery

## 2017-09-02 ENCOUNTER — Encounter (HOSPITAL_COMMUNITY): Payer: Self-pay | Admitting: Surgery

## 2017-09-02 NOTE — Telephone Encounter (Signed)
-----   Message from Mena Goes, RN sent at 09/02/2017  8:47 AM EST ----- Regarding: 2-3 weeks   ----- Message ----- From: Gabriel Earing, PA-C Sent: 09/02/2017   8:21 AM To: [facility] Charge Pool  S/p left CEA.  F/u with Dr. Trula Slade in 2-3 weeks.  Thanks

## 2017-09-02 NOTE — Telephone Encounter (Signed)
Sched appt 09/30/17 at 3:00. Lm on cell#.

## 2017-09-30 ENCOUNTER — Ambulatory Visit (INDEPENDENT_AMBULATORY_CARE_PROVIDER_SITE_OTHER): Payer: BLUE CROSS/BLUE SHIELD | Admitting: Surgery

## 2017-09-30 ENCOUNTER — Encounter: Payer: Self-pay | Admitting: Surgery

## 2017-09-30 VITALS — BP 137/86 | HR 104 | Temp 99.1°F | Resp 16 | Ht 69.0 in | Wt 115.0 lb

## 2017-09-30 DIAGNOSIS — Z9889 Other specified postprocedural states: Secondary | ICD-10-CM

## 2017-09-30 DIAGNOSIS — I6529 Occlusion and stenosis of unspecified carotid artery: Secondary | ICD-10-CM

## 2017-09-30 NOTE — Progress Notes (Signed)
  POST OPERATIVE OFFICE NOTE    CC:  F/u for surgery  HPI:  This is a 62 y.o. male who is s/p left carotid endarterectomy by Dr. Trula Slade on 08/30/17.  He was initially seen in June 2018 and at that time he had an episode of transient visual loss in the left eye.  He had 40-59% stenosis in the left carotid artery.  It was not felt that he had amaurosis and so medical management was recommended.   He followed up in December and was found to have greater than 80% stenosis of the left carotid artery and then proceeded to left carotid endarterectomy.  Post operatively, he did have a slight left marginal mandibular neuropraxia.   He presents today for follow up.  The right carotid artery was stable at 60-79%.  He has done well since being home.  He states he had some back pain the first night home, but this did improve.  He has not had any neurological symptoms.    No Known Allergies  Current Outpatient Medications  Medication Sig Dispense Refill  . aspirin EC 81 MG tablet Take 81 mg by mouth daily.    Marland Kitchen atorvastatin (LIPITOR) 10 MG tablet Take 10 mg by mouth daily at 6 PM.     . meloxicam (MOBIC) 15 MG tablet Take 1 tablet (15 mg total) by mouth daily. (Patient not taking: Reported on 08/20/2017) 30 tablet 2  . metFORMIN (GLUCOPHAGE) 500 MG tablet Take 500 mg by mouth daily with breakfast.     . ONE TOUCH ULTRA TEST test strip     . ONETOUCH DELICA LANCETS FINE MISC     . oxyCODONE-acetaminophen (ROXICET) 5-325 MG tablet Take 1 tablet by mouth every 6 (six) hours as needed for severe pain. 8 tablet 0  . ranitidine (ZANTAC) 150 MG tablet Take 150 mg by mouth daily as needed for heartburn.      No current facility-administered medications for this visit.      ROS:  See HPI  Physical Exam:  Vitals:   09/30/17 1505 09/30/17 1511  BP: 124/80 137/86  Pulse: (!) 104   Resp: 16   Temp: 99.1 F (37.3 C)   SpO2: 91%    Vitals:   09/30/17 1505  Weight: 115 lb (52.2 kg)  Height: 5\' 9"   (1.753 m)   Body mass index is 16.98 kg/m.   Incision:  Clean and dry without hematoma Extremities:  Moving all extremities equally Neuro: in tact.    Assessment/Plan:  This is a 62 y.o. male who is s/p: Left carotid endarterectomy on 08/30/17 by Dr. Trula Slade  -pt doing well and neuro in tact-still has a slight marginal mandibular neuropraxia - this should resolve over the next few weeks. -pt may return to work -f/u in 6 months with carotid duplex -continue aspirin/statin   Leontine Locket, PA-C [facility] [phone]  Clinic MD:  Pt seen and examined with Dr. Trula Slade  I agree with the above.  I seen evaluate the patient.  He is status post left carotid endarterectomy on 08/30/2017 for possible symptomatic left carotid stenosis.  Intraoperative findings included a 90% stenosis.  His plaque went down to the clavicle requiring extensive endarterectomy of the common carotid artery with primary closure.  His postoperative course was uncomplicated.  He is doing well today.  I will have him follow-up in 6 months with repeat duplex  [name]

## 2017-10-01 NOTE — Addendum Note (Signed)
Addended by: Lianne Cure A on: 10/01/2017 03:22 PM   Modules accepted: Orders

## 2017-10-22 DIAGNOSIS — J449 Chronic obstructive pulmonary disease, unspecified: Secondary | ICD-10-CM | POA: Diagnosis not present

## 2017-10-22 DIAGNOSIS — Z1389 Encounter for screening for other disorder: Secondary | ICD-10-CM | POA: Diagnosis not present

## 2017-10-22 DIAGNOSIS — E46 Unspecified protein-calorie malnutrition: Secondary | ICD-10-CM | POA: Diagnosis not present

## 2017-10-22 DIAGNOSIS — R7303 Prediabetes: Secondary | ICD-10-CM | POA: Diagnosis not present

## 2018-01-03 DIAGNOSIS — H5213 Myopia, bilateral: Secondary | ICD-10-CM | POA: Diagnosis not present

## 2018-02-19 DIAGNOSIS — J310 Chronic rhinitis: Secondary | ICD-10-CM | POA: Diagnosis not present

## 2018-02-19 DIAGNOSIS — R7303 Prediabetes: Secondary | ICD-10-CM | POA: Diagnosis not present

## 2018-02-19 DIAGNOSIS — F419 Anxiety disorder, unspecified: Secondary | ICD-10-CM | POA: Diagnosis not present

## 2018-02-19 DIAGNOSIS — J449 Chronic obstructive pulmonary disease, unspecified: Secondary | ICD-10-CM | POA: Diagnosis not present

## 2018-04-14 ENCOUNTER — Ambulatory Visit: Payer: BLUE CROSS/BLUE SHIELD | Admitting: Surgery

## 2018-04-14 ENCOUNTER — Encounter (HOSPITAL_COMMUNITY): Payer: BLUE CROSS/BLUE SHIELD

## 2018-04-29 ENCOUNTER — Other Ambulatory Visit: Payer: Self-pay | Admitting: Internal Medicine

## 2018-04-29 ENCOUNTER — Ambulatory Visit
Admission: RE | Admit: 2018-04-29 | Discharge: 2018-04-29 | Disposition: A | Discharge status: Home / Self Care | Payer: BLUE CROSS/BLUE SHIELD | Source: Ambulatory Visit | Attending: Internal Medicine | Admitting: Internal Medicine

## 2018-04-29 DIAGNOSIS — R0789 Other chest pain: Secondary | ICD-10-CM

## 2018-04-29 DIAGNOSIS — R079 Chest pain, unspecified: Secondary | ICD-10-CM | POA: Diagnosis not present

## 2018-05-19 ENCOUNTER — Ambulatory Visit (HOSPITAL_COMMUNITY): Payer: BLUE CROSS/BLUE SHIELD | Attending: Surgery

## 2018-05-19 ENCOUNTER — Ambulatory Visit: Payer: BLUE CROSS/BLUE SHIELD | Admitting: Surgery

## 2018-05-19 ENCOUNTER — Encounter: Payer: Self-pay | Admitting: Surgery

## 2018-05-22 DIAGNOSIS — Z125 Encounter for screening for malignant neoplasm of prostate: Secondary | ICD-10-CM | POA: Diagnosis not present

## 2018-05-22 DIAGNOSIS — Z0000 Encounter for general adult medical examination without abnormal findings: Secondary | ICD-10-CM | POA: Diagnosis not present

## 2018-05-22 DIAGNOSIS — R7303 Prediabetes: Secondary | ICD-10-CM | POA: Diagnosis not present

## 2018-05-22 DIAGNOSIS — Z23 Encounter for immunization: Secondary | ICD-10-CM | POA: Diagnosis not present

## 2018-05-22 DIAGNOSIS — E782 Mixed hyperlipidemia: Secondary | ICD-10-CM | POA: Diagnosis not present

## 2018-05-22 DIAGNOSIS — Z1389 Encounter for screening for other disorder: Secondary | ICD-10-CM | POA: Diagnosis not present

## 2018-06-11 DIAGNOSIS — L600 Ingrowing nail: Secondary | ICD-10-CM | POA: Diagnosis not present

## 2018-06-11 DIAGNOSIS — B351 Tinea unguium: Secondary | ICD-10-CM | POA: Diagnosis not present

## 2018-06-11 DIAGNOSIS — E119 Type 2 diabetes mellitus without complications: Secondary | ICD-10-CM | POA: Diagnosis not present

## 2018-06-11 DIAGNOSIS — M79674 Pain in right toe(s): Secondary | ICD-10-CM | POA: Diagnosis not present

## 2018-11-20 DIAGNOSIS — F17210 Nicotine dependence, cigarettes, uncomplicated: Secondary | ICD-10-CM | POA: Diagnosis not present

## 2018-11-20 DIAGNOSIS — E46 Unspecified protein-calorie malnutrition: Secondary | ICD-10-CM | POA: Diagnosis not present

## 2018-11-20 DIAGNOSIS — R7303 Prediabetes: Secondary | ICD-10-CM | POA: Diagnosis not present

## 2018-11-20 DIAGNOSIS — J449 Chronic obstructive pulmonary disease, unspecified: Secondary | ICD-10-CM | POA: Diagnosis not present

## 2019-04-16 ENCOUNTER — Other Ambulatory Visit: Payer: Self-pay

## 2019-04-16 DIAGNOSIS — I6523 Occlusion and stenosis of bilateral carotid arteries: Secondary | ICD-10-CM

## 2019-04-24 ENCOUNTER — Telehealth (HOSPITAL_COMMUNITY): Payer: Self-pay

## 2019-04-24 NOTE — Telephone Encounter (Signed)

## 2019-04-27 ENCOUNTER — Ambulatory Visit (INDEPENDENT_AMBULATORY_CARE_PROVIDER_SITE_OTHER): Payer: BC Managed Care – PPO | Admitting: Surgery

## 2019-04-27 ENCOUNTER — Ambulatory Visit (HOSPITAL_COMMUNITY)
Admission: RE | Admit: 2019-04-27 | Discharge: 2019-04-27 | Disposition: A | Discharge status: Home / Self Care | Payer: BC Managed Care – PPO | Source: Ambulatory Visit | Attending: Surgery | Admitting: Surgery

## 2019-04-27 ENCOUNTER — Encounter: Payer: Self-pay | Admitting: Surgery

## 2019-04-27 ENCOUNTER — Other Ambulatory Visit: Payer: Self-pay

## 2019-04-27 VITALS — BP 144/88 | HR 77 | Temp 97.2°F | Resp 20 | Ht 69.0 in | Wt 110.0 lb

## 2019-04-27 DIAGNOSIS — I6523 Occlusion and stenosis of bilateral carotid arteries: Secondary | ICD-10-CM | POA: Insufficient documentation

## 2019-04-27 NOTE — Progress Notes (Signed)
[facility] [place]  Patient name: Jose Welch MRN: [id number] DOB: [birth year] Sex: adult   REASON FOR VISIT:    Follow up carotid  HISOTRY OF PRESENT ILLNESS:    Jose Welch is a 63 y.o. adult who presented with possible symptomatic left carotid stenosis with transient vision loss in the left eye.  He underwent left carotid endarterectomy on 08/30/2017.  Intraoperative findings included a 90% stenosis with plaque going down to the clavicle requiring extensive endarterectomy of the common carotid artery with primary closure.  He has no complaints today.  He denies any neurologic symptoms.  Specifically denies numbness or weakness in either extremity.  He denies amaurosis fugax.  He continues to take a statin as well as aspirin.   PAST MEDICAL HISTORY:   Past Medical History:  Diagnosis Date  . Anxiety   . Arthritis    Right wrist  . Carotid artery occlusion   . Carotid stenosis, left   . COPD (chronic obstructive pulmonary disease) ([place])   . Diabetes mellitus without complication ([place])    Type II  . GERD (gastroesophageal reflux disease)   . History of hiatal hernia      FAMILY HISTORY:   No family history on file.  SOCIAL HISTORY:   Social History   Tobacco Use  . Smoking status: Current Every Day Smoker    Packs/day: 0.50    Types: Cigarettes  . Smokeless tobacco: Never Used  . Tobacco comment: 1/2 pk per day  Substance Use Topics  . Alcohol use: Yes    Alcohol/week: 0.0 standard drinks    Comment: occasional     ALLERGIES:   No Known Allergies   CURRENT MEDICATIONS:   Current Outpatient Medications  Medication Sig Dispense Refill  . aspirin EC 81 MG tablet Take 81 mg by mouth daily.    Marland Kitchen atorvastatin (LIPITOR) 10 MG tablet Take 10 mg by mouth daily at 6 PM.     . meloxicam (MOBIC) 15 MG tablet Take 1 tablet (15 mg total) by mouth daily. 30 tablet 2  . metFORMIN (GLUCOPHAGE) 500 MG  tablet Take 500 mg by mouth daily with breakfast.     . ONE TOUCH ULTRA TEST test strip     . ONETOUCH DELICA LANCETS FINE MISC     . oxyCODONE-acetaminophen (ROXICET) 5-325 MG tablet Take 1 tablet by mouth every 6 (six) hours as needed for severe pain. 8 tablet 0  . ranitidine (ZANTAC) 150 MG tablet Take 150 mg by mouth daily as needed for heartburn.      No current facility-administered medications for this visit.     REVIEW OF SYSTEMS:   [X]  denotes positive finding, [ ]  denotes negative finding Cardiac  Comments:  Chest pain or chest pressure:    Shortness of breath upon exertion:    Short of breath when lying flat:    Irregular heart rhythm:        Vascular    Pain in calf, thigh, or hip brought on by ambulation:    Pain in feet at night that wakes you up from your sleep:     Blood clot in your veins:    Leg swelling:         Pulmonary    Oxygen at home:    Productive cough:     Wheezing:         Neurologic    Sudden weakness in arms or legs:     Sudden numbness  in arms or legs:     Sudden onset of difficulty speaking or slurred speech:    Temporary loss of vision in one eye:     Problems with dizziness:         Gastrointestinal    Blood in stool:     Vomited blood:         Genitourinary    Burning when urinating:     Blood in urine:        Psychiatric    Major depression:         Hematologic    Bleeding problems:    Problems with blood clotting too easily:        Skin    Rashes or ulcers:        Constitutional    Fever or chills:      PHYSICAL EXAM:   Vitals:   04/27/19 1355 04/27/19 1359  BP: 129/80 (!) 144/88  Pulse: 77   Resp: 20   Temp: (!) 97.2 F (36.2 C)   TempSrc: Temporal   SpO2: 100%   Weight: 110 lb (49.9 kg)   Height: 5\' 9"  (1.753 m)     GENERAL: The patient is a well-nourished adult, in no acute distress. The vital signs are documented above. CARDIAC: There is a regular rate and rhythm.  VASCULAR: Carotid incision has  healed nicely PULMONARY: Non-labored respirations MUSCULOSKELETAL: There are no major deformities or cyanosis. NEUROLOGIC: No focal weakness or paresthesias are detected. SKIN: There are no ulcers or rashes noted. PSYCHIATRIC: The patient has a normal affect.  STUDIES:   I have ordered and reviewed the following: Right Carotid: Velocities in the right ICA are consistent with a 60-79%                stenosis. The ECA appears <50% stenosed.  Left Carotid: Velocities in the left ICA are consistent with a 60-79% stenosis.               Non-hemodynamically significant plaque <50% noted in the CCA. The               ECA appears <50% stenosed.  MEDICAL ISSUES:   Bilateral carotid stenosis: We discussed the extent of disease bilaterally as well as the ipsilateral recurrence on the left.  No intervention is recommended at this time.  I would still continue to monitor him until his stenosis progresses to greater than 80%.  He may be a candidate for stenting on the left side depending on the location and extent of the disease.  I will have him follow-up with me in 6 months with a repeat carotid ultrasound.    Leia Alf, MD, FACS [facility] [phone] Pager [phone]

## 2019-05-27 DIAGNOSIS — Z23 Encounter for immunization: Secondary | ICD-10-CM | POA: Diagnosis not present

## 2019-05-27 DIAGNOSIS — E782 Mixed hyperlipidemia: Secondary | ICD-10-CM | POA: Diagnosis not present

## 2019-05-27 DIAGNOSIS — Z125 Encounter for screening for malignant neoplasm of prostate: Secondary | ICD-10-CM | POA: Diagnosis not present

## 2019-05-27 DIAGNOSIS — Z0000 Encounter for general adult medical examination without abnormal findings: Secondary | ICD-10-CM | POA: Diagnosis not present

## 2019-05-27 DIAGNOSIS — R7303 Prediabetes: Secondary | ICD-10-CM | POA: Diagnosis not present

## 2019-12-16 DIAGNOSIS — F17210 Nicotine dependence, cigarettes, uncomplicated: Secondary | ICD-10-CM | POA: Diagnosis not present

## 2019-12-16 DIAGNOSIS — E782 Mixed hyperlipidemia: Secondary | ICD-10-CM | POA: Diagnosis not present

## 2019-12-16 DIAGNOSIS — K635 Polyp of colon: Secondary | ICD-10-CM | POA: Diagnosis not present

## 2019-12-16 DIAGNOSIS — K219 Gastro-esophageal reflux disease without esophagitis: Secondary | ICD-10-CM | POA: Diagnosis not present

## 2019-12-16 DIAGNOSIS — E46 Unspecified protein-calorie malnutrition: Secondary | ICD-10-CM | POA: Diagnosis not present

## 2019-12-16 DIAGNOSIS — R7309 Other abnormal glucose: Secondary | ICD-10-CM | POA: Diagnosis not present

## 2019-12-16 DIAGNOSIS — I700 Atherosclerosis of aorta: Secondary | ICD-10-CM | POA: Diagnosis not present

## 2019-12-16 DIAGNOSIS — I779 Disorder of arteries and arterioles, unspecified: Secondary | ICD-10-CM | POA: Diagnosis not present

## 2019-12-16 DIAGNOSIS — J449 Chronic obstructive pulmonary disease, unspecified: Secondary | ICD-10-CM | POA: Diagnosis not present

## 2019-12-21 ENCOUNTER — Ambulatory Visit: Payer: BC Managed Care – PPO | Admitting: Surgery

## 2019-12-21 ENCOUNTER — Encounter (HOSPITAL_COMMUNITY): Payer: BC Managed Care – PPO

## 2019-12-30 ENCOUNTER — Other Ambulatory Visit: Payer: Self-pay | Admitting: *Deleted

## 2019-12-30 DIAGNOSIS — I6523 Occlusion and stenosis of bilateral carotid arteries: Secondary | ICD-10-CM

## 2020-01-04 ENCOUNTER — Ambulatory Visit (INDEPENDENT_AMBULATORY_CARE_PROVIDER_SITE_OTHER): Payer: Medicare HMO | Admitting: Surgery

## 2020-01-04 ENCOUNTER — Encounter: Payer: Self-pay | Admitting: Surgery

## 2020-01-04 ENCOUNTER — Other Ambulatory Visit: Payer: Self-pay

## 2020-01-04 ENCOUNTER — Ambulatory Visit (HOSPITAL_COMMUNITY)
Admission: RE | Admit: 2020-01-04 | Discharge: 2020-01-04 | Disposition: A | Discharge status: Home / Self Care | Payer: Medicare HMO | Source: Ambulatory Visit | Attending: Surgery | Admitting: Surgery

## 2020-01-04 VITALS — BP 147/86 | HR 94 | Temp 97.9°F | Resp 20 | Ht 69.0 in | Wt 117.0 lb

## 2020-01-04 DIAGNOSIS — I6523 Occlusion and stenosis of bilateral carotid arteries: Secondary | ICD-10-CM

## 2020-01-04 DIAGNOSIS — E119 Type 2 diabetes mellitus without complications: Secondary | ICD-10-CM | POA: Diagnosis not present

## 2020-01-04 NOTE — Progress Notes (Signed)
[facility] [place]  Patient name: Jose Welch MRN: [id number] DOB: 02/25/1956 Sex: adult   REASON FOR VISIT:    Follow up  HISOTRY OF PRESENT ILLNESS:    Jose Welch is a 64 y.o. adult who presented with symptomatic left carotid stenosis with transient vision loss in the left eye.  He underwent left carotid endarterectomy on 08/30/2017.  Intraoperative findings included a 90% stenosis with plaque going down to the clavicle requiring extensive endarterectomy of the common carotid artery with primary closure.  The patient manages his hypercholesterolemia with a statin.  He has COPD secondary to ongoing smoking.  He is a type II diabetic.  He is asymptomatic from a carotid perspective.  Specifically denies numbness or weakness in either extremity.  He denies slurred speech.  Denies amaurosis fugax.   PAST MEDICAL HISTORY:   Past Medical History:  Diagnosis Date  . Anxiety   . Arthritis    Right wrist  . Carotid artery occlusion   . Carotid stenosis, left   . COPD (chronic obstructive pulmonary disease) ([place])   . Diabetes mellitus without complication ([place])    Type II  . GERD (gastroesophageal reflux disease)   . History of hiatal hernia      FAMILY HISTORY:   History reviewed. No pertinent family history.  SOCIAL HISTORY:   Social History   Tobacco Use  . Smoking status: Current Every Day Smoker    Packs/day: 1.00    Types: Cigarettes  . Smokeless tobacco: Never Used  . Tobacco comment: 1/2 pk per day  Substance Use Topics  . Alcohol use: Yes    Alcohol/week: 0.0 standard drinks    Comment: occasional     ALLERGIES:   No Known Allergies   CURRENT MEDICATIONS:   Current Outpatient Medications  Medication Sig Dispense Refill  . aspirin EC 81 MG tablet Take 81 mg by mouth daily.    Marland Kitchen atorvastatin (LIPITOR) 10 MG tablet Take 10 mg by mouth daily at 6 PM.     . famotidine (PEPCID) 20 MG  tablet Take 20 mg by mouth 2 (two) times daily.    . meloxicam (MOBIC) 15 MG tablet Take 1 tablet (15 mg total) by mouth daily. 30 tablet 2  . metFORMIN (GLUCOPHAGE) 500 MG tablet Take 500 mg by mouth daily with breakfast.     . ONE TOUCH ULTRA TEST test strip     . ONETOUCH DELICA LANCETS FINE MISC      No current facility-administered medications for this visit.    REVIEW OF SYSTEMS:   [X]  denotes positive finding, [ ]  denotes negative finding Cardiac  Comments:  Chest pain or chest pressure:     Shortness of breath upon exertion:    Short of breath when lying flat:    Irregular heart rhythm:        Vascular    Pain in calf, thigh, or hip brought on by ambulation:    Pain in feet at night that wakes you up from your sleep:     Blood clot in your veins:    Leg swelling:         Pulmonary    Oxygen at home:    Productive cough:     Wheezing:         Neurologic    Sudden weakness in arms or legs:     Sudden numbness in arms or legs:     Sudden onset of difficulty speaking or  slurred speech:    Temporary loss of vision in one eye:     Problems with dizziness:         Gastrointestinal    Blood in stool:     Vomited blood:         Genitourinary    Burning when urinating:     Blood in urine:        Psychiatric    Major depression:         Hematologic    Bleeding problems:    Problems with blood clotting too easily:        Skin    Rashes or ulcers:        Constitutional    Fever or chills:      PHYSICAL EXAM:   Vitals:   01/04/20 1553 01/04/20 1555  BP: 126/83 (!) 147/86  Pulse: 94   Resp: 20   Temp: 97.9 F (36.6 C)   SpO2: 95%   Weight: 117 lb (53.1 kg)   Height: 5\' 9"  (1.753 m)     GENERAL: The patient is a well-nourished adult, in no acute distress. The vital signs are documented above. CARDIAC: There is a regular rate and rhythm.  VASCULAR: Palpable dorsalis pedis pulses bilaterally PULMONARY: Non-labored respirations ABDOMEN: Soft and  non-tender.  No pulsatile mass MUSCULOSKELETAL: There are no major deformities or cyanosis. NEUROLOGIC: No focal weakness or paresthesias are detected. SKIN: There are no ulcers or rashes noted. PSYCHIATRIC: The patient has a normal affect.  STUDIES:   I have reviewed the following duplex:  Right Carotid: Velocities in the right ICA are consistent with a 60-79%         stenosis.   Left Carotid: Velocities in the left ICA are consistent with a 40-59%  stenosis,        upper end of range. The ECA appears >50% stenosed.   Vertebrals: Bilateral vertebral arteries demonstrate antegrade flow.  Subclavians: Normal flow hemodynamics were seen in bilateral subclavian        arteries.   MEDICAL ISSUES:   Carotid stenosis: The patient has a moderate recurrence on the left and asymptomatic moderate stenosis on the right.  He remains asymptomatic.  We discussed smoking cessation today.  He will continue medical management.  He will return in 6 months for repeat carotid duplex.    Leia Alf, MD, FACS [facility] [phone] Pager [phone]

## 2020-01-06 ENCOUNTER — Other Ambulatory Visit: Payer: Self-pay | Admitting: *Deleted

## 2020-01-06 DIAGNOSIS — I6523 Occlusion and stenosis of bilateral carotid arteries: Secondary | ICD-10-CM

## 2020-03-09 DIAGNOSIS — Z1159 Encounter for screening for other viral diseases: Secondary | ICD-10-CM | POA: Diagnosis not present

## 2020-03-14 DIAGNOSIS — Z86010 Personal history of colonic polyps: Secondary | ICD-10-CM | POA: Diagnosis not present

## 2020-03-14 DIAGNOSIS — K5730 Diverticulosis of large intestine without perforation or abscess without bleeding: Secondary | ICD-10-CM | POA: Diagnosis not present

## 2020-03-14 DIAGNOSIS — D128 Benign neoplasm of rectum: Secondary | ICD-10-CM | POA: Diagnosis not present

## 2020-03-17 DIAGNOSIS — D128 Benign neoplasm of rectum: Secondary | ICD-10-CM | POA: Diagnosis not present

## 2020-06-21 DIAGNOSIS — E46 Unspecified protein-calorie malnutrition: Secondary | ICD-10-CM | POA: Diagnosis not present

## 2020-06-21 DIAGNOSIS — E782 Mixed hyperlipidemia: Secondary | ICD-10-CM | POA: Diagnosis not present

## 2020-06-21 DIAGNOSIS — Z720 Tobacco use: Secondary | ICD-10-CM | POA: Diagnosis not present

## 2020-06-21 DIAGNOSIS — R7309 Other abnormal glucose: Secondary | ICD-10-CM | POA: Diagnosis not present

## 2020-06-21 DIAGNOSIS — J449 Chronic obstructive pulmonary disease, unspecified: Secondary | ICD-10-CM | POA: Diagnosis not present

## 2020-06-21 DIAGNOSIS — K219 Gastro-esophageal reflux disease without esophagitis: Secondary | ICD-10-CM | POA: Diagnosis not present

## 2020-06-21 DIAGNOSIS — Z1389 Encounter for screening for other disorder: Secondary | ICD-10-CM | POA: Diagnosis not present

## 2020-06-21 DIAGNOSIS — Z0000 Encounter for general adult medical examination without abnormal findings: Secondary | ICD-10-CM | POA: Diagnosis not present

## 2020-06-21 DIAGNOSIS — Z125 Encounter for screening for malignant neoplasm of prostate: Secondary | ICD-10-CM | POA: Diagnosis not present

## 2020-06-21 DIAGNOSIS — I779 Disorder of arteries and arterioles, unspecified: Secondary | ICD-10-CM | POA: Diagnosis not present

## 2020-06-21 DIAGNOSIS — F419 Anxiety disorder, unspecified: Secondary | ICD-10-CM | POA: Diagnosis not present

## 2020-06-21 DIAGNOSIS — I700 Atherosclerosis of aorta: Secondary | ICD-10-CM | POA: Diagnosis not present

## 2020-08-08 ENCOUNTER — Other Ambulatory Visit: Payer: Self-pay

## 2020-08-08 ENCOUNTER — Ambulatory Visit (HOSPITAL_COMMUNITY)
Admission: RE | Admit: 2020-08-08 | Discharge: 2020-08-08 | Disposition: A | Discharge status: Home / Self Care | Payer: Medicare HMO | Source: Ambulatory Visit | Attending: Surgery | Admitting: Surgery

## 2020-08-08 ENCOUNTER — Encounter: Payer: Self-pay | Admitting: Surgery

## 2020-08-08 ENCOUNTER — Ambulatory Visit (INDEPENDENT_AMBULATORY_CARE_PROVIDER_SITE_OTHER): Payer: Medicare HMO | Admitting: Surgery

## 2020-08-08 VITALS — BP 136/86 | HR 80 | Temp 98.3°F | Resp 20 | Ht 69.0 in | Wt 115.4 lb

## 2020-08-08 DIAGNOSIS — I6523 Occlusion and stenosis of bilateral carotid arteries: Secondary | ICD-10-CM | POA: Insufficient documentation

## 2020-08-08 NOTE — Progress Notes (Signed)
[facility] [place]  Patient name: Jose Welch MRN: [id number] DOB: [birth year] Sex: adult   REASON FOR VISIT:    Follow up  HISOTRY OF PRESENT ILLNESS:   Jose Welch a 64 y.o.adultwho presented with symptomatic left carotid stenosis with transient vision loss in the left eye. He underwent left carotid endarterectomy on 08/30/2017. Intraoperative findings included a 90% stenosis with plaque going down to the clavicle requiring extensive endarterectomy of the common carotid artery with primary closure.  The patient manages his hypercholesterolemia with a statin.  He has COPD secondary to ongoing smoking.  He is a type II diabetic.  He is asymptomatic from a carotid perspective.  Specifically denies numbness or weakness in either extremity.    He ath denies slurred speech.  Denies amaurosis fugax.   PAST MEDICAL HISTORY:   Past Medical History:  Diagnosis Date   Anxiety    Arthritis    Right wrist   Carotid artery occlusion    Carotid stenosis, left    COPD (chronic obstructive pulmonary disease) (HCC)    Diabetes mellitus without complication (HCC)    Type II   GERD (gastroesophageal reflux disease)    History of hiatal hernia      FAMILY HISTORY:   History reviewed. No pertinent family history.  SOCIAL HISTORY:   Social History   Tobacco Use   Smoking status: Current Every Day Smoker    Packs/day: 1.00    Types: Cigarettes   Smokeless tobacco: Never Used  Substance Use Topics   Alcohol use: Yes    Alcohol/week: 0.0 standard drinks    Comment: occasional     ALLERGIES:   No Known Allergies   CURRENT MEDICATIONS:   Current Outpatient Medications  Medication Sig Dispense Refill   aspirin EC 81 MG tablet Take 81 mg by mouth daily.     atorvastatin (LIPITOR) 10 MG tablet Take 10 mg by mouth daily at 6 PM.      famotidine (PEPCID) 20 MG tablet Take 20 mg by mouth 2 (two)  times daily.     meloxicam (MOBIC) 15 MG tablet Take 1 tablet (15 mg total) by mouth daily. 30 tablet 2   metFORMIN (GLUCOPHAGE) 500 MG tablet Take 500 mg by mouth daily with breakfast.      ONE TOUCH ULTRA TEST test strip      ONETOUCH DELICA LANCETS FINE MISC      albuterol (VENTOLIN HFA) 108 (90 Base) MCG/ACT inhaler      ALPRAZolam (XANAX) 0.25 MG tablet      fluticasone (FLONASE) 50 MCG/ACT nasal spray      No current facility-administered medications for this visit.    REVIEW OF SYSTEMS:   [X]  denotes positive finding, [ ]  denotes negative finding Cardiac  Comments:  Chest pain or chest pressure:    Shortness of breath upon exertion:    Short of breath when lying flat:    Irregular heart rhythm:        Vascular    Pain in calf, thigh, or hip brought on by ambulation:    Pain in feet at night that wakes you up from your sleep:     Blood clot in your veins:    Leg swelling:         Pulmonary    Oxygen at home:    Productive cough:     Wheezing:         Neurologic    Sudden weakness in arms  or legs:     Sudden numbness in arms or legs:     Sudden onset of difficulty speaking or slurred speech:    Temporary loss of vision in one eye:     Problems with dizziness:         Gastrointestinal    Blood in stool:     Vomited blood:         Genitourinary    Burning when urinating:     Blood in urine:        Psychiatric    Major depression:         Hematologic    Bleeding problems:    Problems with blood clotting too easily:        Skin    Rashes or ulcers:        Constitutional    Fever or chills:      PHYSICAL EXAM:   Vitals:   08/08/20 1224 08/08/20 1225  BP: (!) 151/97 136/86  Pulse: 80   Resp: 20   Temp: 98.3 F (36.8 C)   SpO2: 93%   Weight: 115 lb 6.4 oz (52.3 kg)   Height: 5\' 9"  (1.753 m)     Patient left without being seen  STUDIES:   I have reviewed the following duplex:   MEDICAL ISSUES:   Patient left without being  seen    Leia Alf, MD, FACS [facility] [phone] Pager [phone]

## 2020-08-09 ENCOUNTER — Other Ambulatory Visit: Payer: Self-pay

## 2020-08-09 DIAGNOSIS — I6523 Occlusion and stenosis of bilateral carotid arteries: Secondary | ICD-10-CM

## 2020-08-10 ENCOUNTER — Telehealth: Payer: Self-pay

## 2020-08-10 NOTE — Telephone Encounter (Signed)
Pt called regarding his u/s results. He left his MD visit this week without being seen. MD has been made aware.

## 2020-09-15 DIAGNOSIS — H5213 Myopia, bilateral: Secondary | ICD-10-CM | POA: Diagnosis not present

## 2020-10-21 DIAGNOSIS — M79605 Pain in left leg: Secondary | ICD-10-CM | POA: Diagnosis not present

## 2020-10-21 DIAGNOSIS — M25551 Pain in right hip: Secondary | ICD-10-CM | POA: Diagnosis not present

## 2020-10-21 DIAGNOSIS — M25552 Pain in left hip: Secondary | ICD-10-CM | POA: Diagnosis not present

## 2020-10-21 DIAGNOSIS — M79604 Pain in right leg: Secondary | ICD-10-CM | POA: Diagnosis not present

## 2020-10-21 DIAGNOSIS — M5459 Other low back pain: Secondary | ICD-10-CM | POA: Diagnosis not present

## 2020-10-27 DIAGNOSIS — E46 Unspecified protein-calorie malnutrition: Secondary | ICD-10-CM | POA: Diagnosis not present

## 2020-10-27 DIAGNOSIS — K219 Gastro-esophageal reflux disease without esophagitis: Secondary | ICD-10-CM | POA: Diagnosis not present

## 2020-10-27 DIAGNOSIS — I779 Disorder of arteries and arterioles, unspecified: Secondary | ICD-10-CM | POA: Diagnosis not present

## 2020-10-27 DIAGNOSIS — R030 Elevated blood-pressure reading, without diagnosis of hypertension: Secondary | ICD-10-CM | POA: Diagnosis not present

## 2020-10-27 DIAGNOSIS — E782 Mixed hyperlipidemia: Secondary | ICD-10-CM | POA: Diagnosis not present

## 2020-10-27 DIAGNOSIS — I700 Atherosclerosis of aorta: Secondary | ICD-10-CM | POA: Diagnosis not present

## 2020-10-27 DIAGNOSIS — M5136 Other intervertebral disc degeneration, lumbar region: Secondary | ICD-10-CM | POA: Diagnosis not present

## 2020-10-27 DIAGNOSIS — J449 Chronic obstructive pulmonary disease, unspecified: Secondary | ICD-10-CM | POA: Diagnosis not present

## 2020-10-27 DIAGNOSIS — R7309 Other abnormal glucose: Secondary | ICD-10-CM | POA: Diagnosis not present

## 2020-11-10 DIAGNOSIS — M5416 Radiculopathy, lumbar region: Secondary | ICD-10-CM | POA: Diagnosis not present

## 2020-11-17 DIAGNOSIS — M5136 Other intervertebral disc degeneration, lumbar region: Secondary | ICD-10-CM | POA: Diagnosis not present

## 2021-01-26 DIAGNOSIS — M5136 Other intervertebral disc degeneration, lumbar region: Secondary | ICD-10-CM | POA: Diagnosis not present

## 2021-01-26 DIAGNOSIS — F419 Anxiety disorder, unspecified: Secondary | ICD-10-CM | POA: Diagnosis not present

## 2021-01-26 DIAGNOSIS — J449 Chronic obstructive pulmonary disease, unspecified: Secondary | ICD-10-CM | POA: Diagnosis not present

## 2021-01-26 DIAGNOSIS — E46 Unspecified protein-calorie malnutrition: Secondary | ICD-10-CM | POA: Diagnosis not present

## 2021-01-26 DIAGNOSIS — I700 Atherosclerosis of aorta: Secondary | ICD-10-CM | POA: Diagnosis not present

## 2021-01-26 DIAGNOSIS — I779 Disorder of arteries and arterioles, unspecified: Secondary | ICD-10-CM | POA: Diagnosis not present

## 2021-01-26 DIAGNOSIS — F17210 Nicotine dependence, cigarettes, uncomplicated: Secondary | ICD-10-CM | POA: Diagnosis not present

## 2021-01-26 DIAGNOSIS — E782 Mixed hyperlipidemia: Secondary | ICD-10-CM | POA: Diagnosis not present

## 2021-01-26 DIAGNOSIS — R7303 Prediabetes: Secondary | ICD-10-CM | POA: Diagnosis not present

## 2021-02-07 ENCOUNTER — Other Ambulatory Visit: Payer: Self-pay

## 2021-02-07 ENCOUNTER — Ambulatory Visit (HOSPITAL_COMMUNITY)
Admission: RE | Admit: 2021-02-07 | Discharge: 2021-02-07 | Disposition: A | Discharge status: Home / Self Care | Payer: Medicare HMO | Source: Ambulatory Visit | Attending: Vascular Surgery | Admitting: Vascular Surgery

## 2021-02-07 ENCOUNTER — Ambulatory Visit (INDEPENDENT_AMBULATORY_CARE_PROVIDER_SITE_OTHER): Payer: Medicare HMO | Admitting: Physician Assistant

## 2021-02-07 VITALS — BP 152/91 | HR 82 | Temp 98.3°F | Resp 20 | Ht 69.0 in | Wt 112.3 lb

## 2021-02-07 DIAGNOSIS — I6523 Occlusion and stenosis of bilateral carotid arteries: Secondary | ICD-10-CM | POA: Diagnosis not present

## 2021-02-07 DIAGNOSIS — Z9889 Other specified postprocedural states: Secondary | ICD-10-CM | POA: Diagnosis not present

## 2021-02-07 DIAGNOSIS — E119 Type 2 diabetes mellitus without complications: Secondary | ICD-10-CM | POA: Diagnosis not present

## 2021-02-07 NOTE — Progress Notes (Signed)
Office Note     CC:  follow up Requesting Provider:  Wenda Low, MD  HPI: Jose Welch is a 65 y.o. ([birth year]) adult who presents for follow up of carotid artery stenosis. She has history of left CEA on 08/30/17 for symptomatic ICA stenosis with Amaurosis Fugax. He has no other history of TIA or Stroke. He has remained asymptomatic since his surgery.   Today he denies any amaurosis fugax or visual changes, no slurred speech,facial drooping, unilateral upper or lower extremity numbness or weakness. He reports not claudication, rest pain or tissue loss. He does have pins and needles in his feet. He continues to smoke daily.  The pt is on a statin for cholesterol management.  The pt is on a daily aspirin. Other AC: none The pt is not on medication for hypertension.   The pt is diabetic.  Tobacco hx:  Current   Past Medical History:  Diagnosis Date  . Anxiety   . Arthritis    Right wrist  . Carotid artery occlusion   . Carotid stenosis, left   . COPD (chronic obstructive pulmonary disease) ([place])   . Diabetes mellitus without complication ([place])    Type II  . GERD (gastroesophageal reflux disease)   . History of hiatal hernia     Past Surgical History:  Procedure Laterality Date  . COLONOSCOPY W/ POLYPECTOMY    . COLONOSCOPY WITH PROPOFOL N/A 07/18/2015   Procedure: COLONOSCOPY WITH PROPOFOL;  Surgeon: Garlan Fair, MD;  Location: [facility];  Service: Endoscopy;  Laterality: N/A;  . ENDARTERECTOMY Left 08/30/2017   Procedure: ENDARTERECTOMY CAROTID LEFT;  Surgeon: Serafina Mitchell, MD;  Location: [place];  Service: Vascular;  Laterality: Left;  . TONSILLECTOMY    . WISDOM TOOTH EXTRACTION      Social History   Socioeconomic History  . Marital status: Single    Spouse name: Not on file  . Number of children: Not on file  . Years of education: Not on file  . Highest education level: Not on file  Occupational History  . Not on file  Tobacco Use  . Smoking  status: Current Every Day Smoker    Packs/day: 1.00    Types: Cigarettes  . Smokeless tobacco: Never Used  Vaping Use  . Vaping Use: Never used  Substance and Sexual Activity  . Alcohol use: Yes    Alcohol/week: 0.0 standard drinks    Comment: occasional  . Drug use: No  . Sexual activity: Not on file  Other Topics Concern  . Not on file  Social History Narrative  . Not on file   Social Determinants of Health   Financial Resource Strain: Not on file  Food Insecurity: Not on file  Transportation Needs: Not on file  Physical Activity: Not on file  Stress: Not on file  Social Connections: Not on file  Intimate Partner Violence: Not on file   No family history on file.  Current Outpatient Medications  Medication Sig Dispense Refill  . albuterol (VENTOLIN HFA) 108 (90 Base) MCG/ACT inhaler     . ALPRAZolam (XANAX) 0.25 MG tablet     . aspirin EC 81 MG tablet Take 81 mg by mouth daily.    Marland Kitchen atorvastatin (LIPITOR) 10 MG tablet Take 10 mg by mouth daily at 6 PM.     . famotidine (PEPCID) 20 MG tablet Take 20 mg by mouth 2 (two) times daily.    . fluticasone (FLONASE) 50 MCG/ACT nasal spray     .  loratadine (CLARITIN) 10 MG tablet 1 tablet    . meloxicam (MOBIC) 15 MG tablet Take 1 tablet (15 mg total) by mouth daily. 30 tablet 2  . metFORMIN (GLUCOPHAGE) 500 MG tablet Take 500 mg by mouth daily with breakfast.     . ONE TOUCH ULTRA TEST test strip     . ONETOUCH DELICA LANCETS FINE MISC      No current facility-administered medications for this visit.    No Known Allergies   REVIEW OF SYSTEMS:  [X]  denotes positive finding, [ ]  denotes negative finding Cardiac  Comments:  Chest pain or chest pressure:    Shortness of breath upon exertion:    Short of breath when lying flat:    Irregular heart rhythm:        Vascular    Pain in calf, thigh, or hip brought on by ambulation:    Pain in feet at night that wakes you up from your sleep:     Blood clot in your veins:     Leg swelling:         Pulmonary    Oxygen at home:    Productive cough:     Wheezing:         Neurologic    Sudden weakness in arms or legs:     Sudden numbness in arms or legs:     Sudden onset of difficulty speaking or slurred speech:    Temporary loss of vision in one eye:     Problems with dizziness:         Gastrointestinal    Blood in stool:     Vomited blood:         Genitourinary    Burning when urinating:     Blood in urine:        Psychiatric    Major depression:         Hematologic    Bleeding problems:    Problems with blood clotting too easily:        Skin    Rashes or ulcers:        Constitutional    Fever or chills:      PHYSICAL EXAMINATION:  Vitals:   02/07/21 1347 02/07/21 1349  BP: 136/90 (!) 152/91  Pulse: 82   Resp: 20   Temp: 98.3 F (36.8 C)   TempSrc: Temporal   SpO2: 97%   Weight: 112 lb 4.8 oz (50.9 kg)   Height: 5\' 9"  (1.753 m)     General:  WDWN in NAD; vital signs documented above Gait: Normal HENT: WNL, normocephalic Pulmonary: normal non-labored breathing , without wheezing Cardiac: regular HR, without  Murmurs without carotid bruit Abdomen: soft, NT, no masses Vascular Exam/Pulses:  Right Left  Radial 2+ (normal) 2+ (normal)  Femoral 2+ (normal) 2+ (normal)  Popliteal 2+ (normal) 2+ (normal)  DP 2+ (normal) 2+ (normal)  PT 1+ (weak) 1+ (weak)   Musculoskeletal: no muscle wasting or atrophy  Neurologic: A&O X 3;  No focal weakness or paresthesias are detected Psychiatric:  The pt has Normal affect.   Non-Invasive Vascular Imaging:  6/722 VAS US Carotid Duplex: Summary:  Right Carotid: Velocities in the right ICA are consistent with a 60-79% stenosis.   Left Carotid: Velocities in the left ICA are consistent with a 40-59% stenosis.   Vertebrals: Bilateral vertebral arteries demonstrate antegrade flow.  Subclavians: Normal flow hemodynamics were seen in bilateral subclavian arteries.    ASSESSMENT/PLAN::  65 y.o. adult here for  follow up for follow up of carotid artery stenosis. She has history of left CEA on 08/30/17 for symptomatic ICA stenosis with Amaurosis Fugax. - Duplex shows R ICA stenosis of 60-79% , left ICA with 40-59% stenosis. Normal antegrade flow of vertebral arteries and normal hemodynamics of the subclavian arteries - He will continue his Aspirin and Statin - Reviewed signs and symptoms of stroke/ TIA and he understands should it occur to call 911 or go to ER - He will follow up in 6 months for repeat carotid duplex   Karoline Caldwell, PA-C [facility] [phone]  Clinic MD:  Roxanne Mins

## 2021-02-08 ENCOUNTER — Other Ambulatory Visit: Payer: Self-pay

## 2021-02-08 DIAGNOSIS — I6523 Occlusion and stenosis of bilateral carotid arteries: Secondary | ICD-10-CM

## 2021-04-14 ENCOUNTER — Other Ambulatory Visit: Payer: Self-pay | Admitting: Internal Medicine

## 2021-04-14 ENCOUNTER — Ambulatory Visit
Admission: RE | Admit: 2021-04-14 | Discharge: 2021-04-14 | Disposition: A | Discharge status: Home / Self Care | Payer: Medicare HMO | Source: Ambulatory Visit | Attending: Internal Medicine | Admitting: Internal Medicine

## 2021-04-14 ENCOUNTER — Other Ambulatory Visit: Payer: Self-pay

## 2021-04-14 DIAGNOSIS — J449 Chronic obstructive pulmonary disease, unspecified: Secondary | ICD-10-CM

## 2021-04-14 DIAGNOSIS — R9389 Abnormal findings on diagnostic imaging of other specified body structures: Secondary | ICD-10-CM | POA: Diagnosis not present

## 2021-04-14 DIAGNOSIS — F17210 Nicotine dependence, cigarettes, uncomplicated: Secondary | ICD-10-CM | POA: Diagnosis not present

## 2021-04-17 ENCOUNTER — Other Ambulatory Visit: Payer: Self-pay | Admitting: Internal Medicine

## 2021-04-17 DIAGNOSIS — R9389 Abnormal findings on diagnostic imaging of other specified body structures: Secondary | ICD-10-CM

## 2021-04-21 ENCOUNTER — Other Ambulatory Visit (HOSPITAL_COMMUNITY): Payer: Self-pay | Admitting: Radiology

## 2021-06-26 DIAGNOSIS — Z1389 Encounter for screening for other disorder: Secondary | ICD-10-CM | POA: Diagnosis not present

## 2021-06-26 DIAGNOSIS — Z0000 Encounter for general adult medical examination without abnormal findings: Secondary | ICD-10-CM | POA: Diagnosis not present

## 2021-06-26 DIAGNOSIS — J449 Chronic obstructive pulmonary disease, unspecified: Secondary | ICD-10-CM | POA: Diagnosis not present

## 2021-06-26 DIAGNOSIS — Z125 Encounter for screening for malignant neoplasm of prostate: Secondary | ICD-10-CM | POA: Diagnosis not present

## 2021-06-26 DIAGNOSIS — F17210 Nicotine dependence, cigarettes, uncomplicated: Secondary | ICD-10-CM | POA: Diagnosis not present

## 2021-06-26 DIAGNOSIS — Z7984 Long term (current) use of oral hypoglycemic drugs: Secondary | ICD-10-CM | POA: Diagnosis not present

## 2021-06-26 DIAGNOSIS — I700 Atherosclerosis of aorta: Secondary | ICD-10-CM | POA: Diagnosis not present

## 2021-06-26 DIAGNOSIS — I779 Disorder of arteries and arterioles, unspecified: Secondary | ICD-10-CM | POA: Diagnosis not present

## 2021-06-26 DIAGNOSIS — E782 Mixed hyperlipidemia: Secondary | ICD-10-CM | POA: Diagnosis not present

## 2021-06-26 DIAGNOSIS — R9389 Abnormal findings on diagnostic imaging of other specified body structures: Secondary | ICD-10-CM | POA: Diagnosis not present

## 2021-06-26 DIAGNOSIS — E1169 Type 2 diabetes mellitus with other specified complication: Secondary | ICD-10-CM | POA: Diagnosis not present

## 2021-06-26 DIAGNOSIS — E46 Unspecified protein-calorie malnutrition: Secondary | ICD-10-CM | POA: Diagnosis not present

## 2021-07-14 ENCOUNTER — Other Ambulatory Visit: Payer: Self-pay

## 2021-07-14 DIAGNOSIS — I6523 Occlusion and stenosis of bilateral carotid arteries: Secondary | ICD-10-CM

## 2021-07-14 NOTE — Progress Notes (Signed)
Error

## 2021-08-07 ENCOUNTER — Other Ambulatory Visit: Payer: Self-pay

## 2021-08-07 ENCOUNTER — Ambulatory Visit (HOSPITAL_COMMUNITY)
Admission: RE | Admit: 2021-08-07 | Discharge: 2021-08-07 | Disposition: A | Discharge status: Home / Self Care | Payer: Medicare HMO | Source: Ambulatory Visit | Attending: Physician Assistant | Admitting: Physician Assistant

## 2021-08-07 ENCOUNTER — Ambulatory Visit: Payer: Medicare HMO | Admitting: Physician Assistant

## 2021-08-07 VITALS — BP 135/81 | HR 86 | Temp 98.2°F | Resp 20 | Ht 69.0 in | Wt 116.7 lb

## 2021-08-07 DIAGNOSIS — I6523 Occlusion and stenosis of bilateral carotid arteries: Secondary | ICD-10-CM | POA: Insufficient documentation

## 2021-08-07 NOTE — Progress Notes (Signed)
History of Present Illness:  Jose Welch is a 65 y.o. (March 22, 1956) adult who presents for follow up of carotid artery stenosis. She has history of left CEA on 08/30/17 for symptomatic ICA stenosis with Amaurosis Fugax. He has no other history of TIA or Stroke. He has remained asymptomatic since his surgery.    He denise amaurosis, aphasia, or weakness.  He continues to be an everyday smoker.  The pt is on a statin for cholesterol management.  The pt is on a daily aspirin. Other AC: none The pt is not on medication for hypertension.   The pt is diabetic.  Tobacco hx:  Current  Past Medical History:  Diagnosis Date   Anxiety    Arthritis    Right wrist   Carotid artery occlusion    Carotid stenosis, left    COPD (chronic obstructive pulmonary disease) (HCC)    Diabetes mellitus without complication (HCC)    Type II   GERD (gastroesophageal reflux disease)    History of hiatal hernia     Past Surgical History:  Procedure Laterality Date   COLONOSCOPY W/ POLYPECTOMY     COLONOSCOPY WITH PROPOFOL N/A 07/18/2015   Procedure: COLONOSCOPY WITH PROPOFOL;  Surgeon: Garlan Fair, MD;  Location: [facility];  Service: Endoscopy;  Laterality: N/A;   ENDARTERECTOMY Left 08/30/2017   Procedure: ENDARTERECTOMY CAROTID LEFT;  Surgeon: Serafina Mitchell, MD;  [facility];  Service: Vascular;  Laterality: Left;   TONSILLECTOMY     WISDOM TOOTH EXTRACTION       Social History Social History   Tobacco Use   Smoking status: Every Day    Packs/day: 1.00    Types: Cigarettes   Smokeless tobacco: Never  Vaping Use   Vaping Use: Never used  Substance Use Topics   Alcohol use: Yes    Alcohol/week: 0.0 standard drinks    Comment: occasional   Drug use: No    Family History No family history on file.  Allergies  No Known Allergies   Current Outpatient Medications  Medication Sig Dispense Refill   albuterol (VENTOLIN HFA) 108 (90 Base) MCG/ACT inhaler       ALPRAZolam (XANAX) 0.25 MG tablet      aspirin EC 81 MG tablet Take 81 mg by mouth daily.     atorvastatin (LIPITOR) 10 MG tablet Take 10 mg by mouth daily at 6 PM.      famotidine (PEPCID) 20 MG tablet Take 20 mg by mouth 2 (two) times daily.     fluticasone (FLONASE) 50 MCG/ACT nasal spray      loratadine (CLARITIN) 10 MG tablet 1 tablet     meloxicam (MOBIC) 15 MG tablet Take 1 tablet (15 mg total) by mouth daily. 30 tablet 2   metFORMIN (GLUCOPHAGE) 500 MG tablet Take 500 mg by mouth daily with breakfast.      ONE TOUCH ULTRA TEST test strip      ONETOUCH DELICA LANCETS FINE MISC      No current facility-administered medications for this visit.    ROS:   General:  No weight loss, Fever, chills  HEENT: No recent headaches, no nasal bleeding, no visual changes, no sore throat  Neurologic: No dizziness, blackouts, seizures. No recent symptoms of stroke or mini- stroke. No recent episodes of slurred speech, or temporary blindness.  Cardiac: No recent episodes of chest pain/pressure, no shortness of breath at rest.  No shortness of breath with exertion.  Denies history of atrial  fibrillation or irregular heartbeat  Vascular: No history of rest pain in feet.  No history of claudication.  No history of non-healing ulcer, No history of DVT   Pulmonary: No home oxygen, no productive cough, no hemoptysis,  No asthma or wheezing  Musculoskeletal:  [ ]  Arthritis, [ ]  Low back pain,  [ ]  Joint pain  Hematologic:No history of hypercoagulable state.  No history of easy bleeding.  No history of anemia  Gastrointestinal: No hematochezia or melena,  No gastroesophageal reflux, no trouble swallowing  Urinary: [ ]  chronic Kidney disease, [ ]  on HD - [ ]  MWF or [ ]  TTHS, [ ]  Burning with urination, [ ]  Frequent urination, [ ]  Difficulty urinating;   Skin: No rashes  Psychological: No history of anxiety,  No history of depression   Physical Examination  Vitals:   08/07/21 1506 08/07/21 1508   BP: 124/78 135/81  Pulse: 86   Resp: 20   Temp: 98.2 F (36.8 C)   TempSrc: Temporal   SpO2: 95%   Weight: 116 lb 11.2 oz (52.9 kg)   Height: 5\' 9"  (1.753 m)     Body mass index is 17.23 kg/m.  General:  Alert and oriented, no acute distress HEENT: Normal Neck: No bruit or JVD Pulmonary: Clear to auscultation bilaterally Cardiac: Regular Rate and Rhythm without murmur Gastrointestinal: Soft, non-tender, non-distended, no mass, no scars Skin: No rash Musculoskeletal: No deformity or edema  Neurologic: Upper and lower extremity motor 5/5 and symmetric  DATA:  Right Carotid Findings:  +----------+--------+--------+--------+------------------+--------+            PSV cm/sEDV cm/sStenosisPlaque DescriptionComments  +----------+--------+--------+--------+------------------+--------+  CCA Prox  73      19                                          +----------+--------+--------+--------+------------------+--------+  CCA Mid   81      19              heterogenous                +----------+--------+--------+--------+------------------+--------+  CCA Distal94      23              heterogenous                +----------+--------+--------+--------+------------------+--------+  ICA Prox  244     82      60-79%  heterogenous                +----------+--------+--------+--------+------------------+--------+  ICA Mid   170     41                                          +----------+--------+--------+--------+------------------+--------+  ICA Distal70      23                                          +----------+--------+--------+--------+------------------+--------+  ECA       130     10              heterogenous                +----------+--------+--------+--------+------------------+--------+   +----------+--------+-------+----------------+-------------------+  PSV cm/sEDV cmsDescribe        Arm Pressure (mmHG)   +----------+--------+-------+----------------+-------------------+  [id number]     4      Multiphasic, WNL                     +----------+--------+-------+----------------+-------------------+   +---------+--------+--+--------+--+---------+  VertebralPSV cm/s51EDV cm/s19Antegrade  +---------+--------+--+--------+--+---------+       Left Carotid Findings:  +----------+--------+--------+--------+------------------+-----------------  +            PSV cm/sEDV cm/sStenosisPlaque DescriptionComments            +----------+--------+--------+--------+------------------+-----------------  +  CCA Prox  95      32              heterogenous                          +----------+--------+--------+--------+------------------+-----------------  +  CCA Mid   125     41              heterogenous                          +----------+--------+--------+--------+------------------+-----------------  +  CCA Distal177     58              heterogenous                          +----------+--------+--------+--------+------------------+-----------------  +  ICA Prox  187     62      40-59%                    bifurcation /  ICA  +----------+--------+--------+--------+------------------+-----------------  +  ICA Mid   130     46                                                    +----------+--------+--------+--------+------------------+-----------------  +  ICA Distal118     43                                                    +----------+--------+--------+--------+------------------+-----------------  +  ECA       93      11                                                    +----------+--------+--------+--------+------------------+-----------------  +   +----------+--------+--------+----------------+-------------------+            PSV cm/sEDV cm/sDescribe        Arm Pressure (mmHG)   +----------+--------+--------+----------------+-------------------+  [id number]     7       Multiphasic, WNL                     +----------+--------+--------+----------------+-------------------+   +---------+--------+--+--------+--+---------+  VertebralPSV cm/s47EDV cm/s14Antegrade  +---------+--------+--+--------+--+---------+                     ASSESSMENT:   Carotid stenosis Right Carotid: Velocities  in the right ICA are consistent with a 60-79%                 stenosis.   Left Carotid: Velocities in the left ICA are consistent with a 40-59%  stenosis.    He has remained asymptomatic  PLAN: - He will continue his Aspirin and Statin - Reviewed signs and symptoms of stroke/ TIA and he understands should it occur to call 911 or go to ER - He will follow up in 6 months for repeat carotid duplex  Roxy Horseman PA-C [facility] [place]: [phone]  MD in clinic [place]

## 2021-08-09 ENCOUNTER — Other Ambulatory Visit: Payer: Self-pay | Admitting: *Deleted

## 2021-08-09 DIAGNOSIS — I6529 Occlusion and stenosis of unspecified carotid artery: Secondary | ICD-10-CM

## 2021-09-25 ENCOUNTER — Ambulatory Visit (HOSPITAL_COMMUNITY)
Admission: EM | Admit: 2021-09-25 | Discharge: 2021-09-25 | Disposition: A | Discharge status: Home / Self Care | Payer: Medicare HMO | Attending: Student | Admitting: Student

## 2021-09-25 ENCOUNTER — Encounter (HOSPITAL_COMMUNITY): Payer: Self-pay

## 2021-09-25 ENCOUNTER — Other Ambulatory Visit: Payer: Self-pay

## 2021-09-25 DIAGNOSIS — M1712 Unilateral primary osteoarthritis, left knee: Secondary | ICD-10-CM | POA: Diagnosis not present

## 2021-09-25 MED ORDER — DICLOFENAC SODIUM 1 % EX GEL: 4.0000 g | Freq: Four times a day (QID) | CUTANEOUS | 0 refills | Status: AC

## 2021-09-25 NOTE — ED Provider Notes (Signed)
Jose Welch    CSN: [id number] Arrival date & time: 09/25/21  1255      History   Chief Complaint Chief Complaint  Patient presents with   Leg Pain   Knee Pain    HPI Jose Welch is a 66 y.o. adult presenting with L leg and knee pain x24 hours. Medical history COPD, arthritis. Describes 1-2 days of L knee stiffness. Denies triggers including falls or overuse. Pain is worse when going from sitting to standing or when ambulating. Denies sensation changes.  HPI  Past Medical History:  Diagnosis Date   Anxiety    Arthritis    Right wrist   Carotid artery occlusion    Carotid stenosis, left    COPD (chronic obstructive pulmonary disease) (HCC)    Diabetes mellitus without complication (HCC)    Type II   GERD (gastroesophageal reflux disease)    History of hiatal hernia     Patient Active Problem List   Diagnosis Date Noted   Carotid artery stenosis 08/30/2017    Past Surgical History:  Procedure Laterality Date   COLONOSCOPY W/ POLYPECTOMY     COLONOSCOPY WITH PROPOFOL N/A 07/18/2015   Procedure: COLONOSCOPY WITH PROPOFOL;  Surgeon: Garlan Fair, MD;  Location: [facility];  Service: Endoscopy;  Laterality: N/A;   ENDARTERECTOMY Left 08/30/2017   Procedure: ENDARTERECTOMY CAROTID LEFT;  Surgeon: Serafina Mitchell, MD;  Location: [facility];  Service: Vascular;  Laterality: Left;   TONSILLECTOMY     WISDOM TOOTH EXTRACTION         Home Medications    Prior to Admission medications   Medication Sig Start Date End Date Taking? Authorizing Provider  diclofenac Sodium (VOLTAREN) 1 % GEL Apply 4 g topically 4 (four) times daily. 09/25/21  Yes Hazel Sams, PA-C  albuterol (VENTOLIN HFA) 108 [phone] Base) MCG/ACT inhaler  04/29/20   [provider]  ALPRAZolam Duanne Moron) 0.25 MG tablet  05/22/20   [provider]  aspirin EC 81 MG tablet Take 81 mg by mouth daily.    [provider]  atorvastatin (LIPITOR) 10 MG tablet Take 10 mg by  mouth daily at 6 PM.  07/24/17   [provider]  famotidine (PEPCID) 20 MG tablet Take 20 mg by mouth 2 (two) times daily.    [provider]  fluticasone Asencion Islam) 50 MCG/ACT nasal spray  04/25/20   [provider]  loratadine (CLARITIN) 10 MG tablet 1 tablet    [provider]  meloxicam (MOBIC) 15 MG tablet Take 1 tablet (15 mg total) by mouth daily. 04/26/16   Trula Slade, DPM  metFORMIN (GLUCOPHAGE) 500 MG tablet Take 500 mg by mouth daily with breakfast.  07/29/17   [provider]  ONE TOUCH ULTRA TEST test strip  07/30/17   [provider]  [place]  08/01/17   [provider]    Family History History reviewed. No pertinent family history.  Social History Social History   Tobacco Use   Smoking status: Every Day    Packs/day: 1.00    Types: Cigarettes   Smokeless tobacco: Never  Vaping Use   Vaping Use: Never used  Substance Use Topics   Alcohol use: Yes    Alcohol/week: 0.0 standard drinks    Comment: occasional   Drug use: No     Allergies   Patient has no known allergies.   Review of Systems Review of Systems  Musculoskeletal:  L knee pain   All other systems reviewed and are negative.   Physical Exam Triage Vital Signs ED Triage Vitals  Enc Vitals Group     BP 09/25/21 1341 (!) 114/54     Pulse Rate 09/25/21 1341 97     Resp 09/25/21 1341 15     Temp 09/25/21 1341 98.5 F (36.9 C)     Temp Source 09/25/21 1341 Oral     SpO2 09/25/21 1341 96 %     Weight --      Height --      Head Circumference --      Peak Flow --      Pain Score 09/25/21 1340 6     Pain Loc --      Pain Edu? --      Excl. in [place]? --    No data found.  Updated Vital Signs BP (!) 114/54 (BP Location: Left Arm)    Pulse 97    Temp 98.5 F (36.9 C) (Oral)    Resp 15    SpO2 96%   Visual Acuity Right Eye Distance:   Left Eye Distance:   Bilateral Distance:    Right Eye  Near:   Left Eye Near:    Bilateral Near:     Physical Exam Vitals reviewed.  Constitutional:      General: She is not in acute distress.    Appearance: Normal appearance. She is not ill-appearing.  HENT:     Head: Normocephalic and atraumatic.  Pulmonary:     Effort: Pulmonary effort is normal.  Musculoskeletal:     Comments: L knee - no skin changes or effusion. No tenderness to palpation. Crepitus with passive extension. No joint laxity. No calf swelling or tenderness  Neurological:     General: No focal deficit present.     Mental Status: She is alert and oriented to person, place, and time.  Psychiatric:        Mood and Affect: Mood normal.        Behavior: Behavior normal.        Thought Content: Thought content normal.        Judgment: Judgment normal.     UC Treatments / Results  Labs (all labs ordered are listed, but only abnormal results are displayed) Labs Reviewed - No data to display  EKG   Radiology No results found.  Procedures Procedures (including critical care time)  Medications Ordered in UC Medications - No data to display  Initial Impression / Assessment and Plan / UC Course  I have reviewed the triage vital signs and the nursing notes.  Pertinent labs & imaging results that were available during my care of the patient were reviewed by me and considered in my medical decision making (see chart for details).     This patient is a very pleasant 66 y.o. year old male presenting with suspected L knee OA x1 day. Neurovascularly intact. No trauma or overuse. Already with diagnosis of arthritis of multiple joints. Will manage with voltaren and knee brace today. F/u with ortho if symptoms persist, information provided.   Final Clinical Impressions(s) / UC Diagnoses   Final diagnoses:  Primary osteoarthritis of left knee     Discharge Instructions      -L knee - voltaren as needed up to 4x daily.  -Knee brace while pain persists -Tylenol for  pain  -If symptoms persist in 7-10 days, follow-up with an orthopedist. I recommend [facility] at [place]  [address], [place], [place] [address]. You can schedule an appointment by calling [phone]) or online ([email or web address]), but they also have a walk-in clinic M-F 8a-8p and Sat 10a-3p.      ED Prescriptions     Medication Sig Dispense Auth. Provider   diclofenac Sodium (VOLTAREN) 1 % GEL Apply 4 g topically 4 (four) times daily. 100 g Hazel Sams, PA-C      PDMP not reviewed this encounter.   Hazel Sams, PA-C 09/25/21 1424

## 2021-09-25 NOTE — Discharge Instructions (Signed)
-  L knee - voltaren as needed up to 4x daily.  -Knee brace while pain persists -Tylenol for pain  -If symptoms persist in 7-10 days, follow-up with an orthopedist. I recommend [facility] at [address] [place], [place] [address]. You can schedule an appointment by calling [phone]) or online ([email or web address]), but they also have a walk-in clinic M-F 8a-8p and Sat 10a-3p.

## 2021-09-25 NOTE — ED Triage Notes (Signed)
Pt presents with  L leg and knee pain x 24 hours.   States moving quicker hurts his leg and knee.

## 2021-09-28 DIAGNOSIS — M1712 Unilateral primary osteoarthritis, left knee: Secondary | ICD-10-CM | POA: Diagnosis not present

## 2021-09-28 DIAGNOSIS — M25562 Pain in left knee: Secondary | ICD-10-CM | POA: Diagnosis not present

## 2021-10-24 DIAGNOSIS — E1169 Type 2 diabetes mellitus with other specified complication: Secondary | ICD-10-CM | POA: Diagnosis not present

## 2021-10-24 DIAGNOSIS — E782 Mixed hyperlipidemia: Secondary | ICD-10-CM | POA: Diagnosis not present

## 2021-10-24 DIAGNOSIS — R9389 Abnormal findings on diagnostic imaging of other specified body structures: Secondary | ICD-10-CM | POA: Diagnosis not present

## 2021-10-24 DIAGNOSIS — I779 Disorder of arteries and arterioles, unspecified: Secondary | ICD-10-CM | POA: Diagnosis not present

## 2021-10-24 DIAGNOSIS — F17210 Nicotine dependence, cigarettes, uncomplicated: Secondary | ICD-10-CM | POA: Diagnosis not present

## 2021-10-24 DIAGNOSIS — J449 Chronic obstructive pulmonary disease, unspecified: Secondary | ICD-10-CM | POA: Diagnosis not present

## 2021-10-24 DIAGNOSIS — I700 Atherosclerosis of aorta: Secondary | ICD-10-CM | POA: Diagnosis not present

## 2021-11-14 ENCOUNTER — Ambulatory Visit
Admission: RE | Admit: 2021-11-14 | Discharge: 2021-11-14 | Disposition: A | Discharge status: Home / Self Care | Payer: Medicare HMO | Source: Ambulatory Visit | Attending: Internal Medicine | Admitting: Internal Medicine

## 2021-11-14 DIAGNOSIS — J439 Emphysema, unspecified: Secondary | ICD-10-CM | POA: Diagnosis not present

## 2021-11-14 DIAGNOSIS — I700 Atherosclerosis of aorta: Secondary | ICD-10-CM | POA: Diagnosis not present

## 2021-11-14 DIAGNOSIS — R9389 Abnormal findings on diagnostic imaging of other specified body structures: Secondary | ICD-10-CM

## 2021-11-14 DIAGNOSIS — I2510 Atherosclerotic heart disease of native coronary artery without angina pectoris: Secondary | ICD-10-CM | POA: Diagnosis not present

## 2021-11-14 MED ORDER — IOPAMIDOL (ISOVUE-300) INJECTION 61%
75.0000 mL | Freq: Once | INTRAVENOUS | Status: AC | PRN
  Administered 2021-11-14: 75 mL via INTRAVENOUS

## 2022-01-10 ENCOUNTER — Emergency Department (HOSPITAL_COMMUNITY): Payer: Medicare HMO

## 2022-01-10 ENCOUNTER — Inpatient Hospital Stay (HOSPITAL_COMMUNITY)
Admission: EM | Admit: 2022-01-10 | Discharge: 2022-01-13 | DRG: 190 | Disposition: A | Discharge status: Home / Self Care | Payer: Medicare HMO | Attending: Internal Medicine | Admitting: Internal Medicine

## 2022-01-10 ENCOUNTER — Encounter (HOSPITAL_COMMUNITY): Payer: Self-pay | Admitting: *Deleted

## 2022-01-10 ENCOUNTER — Other Ambulatory Visit: Payer: Self-pay

## 2022-01-10 DIAGNOSIS — K219 Gastro-esophageal reflux disease without esophagitis: Secondary | ICD-10-CM | POA: Diagnosis present

## 2022-01-10 DIAGNOSIS — R0902 Hypoxemia: Secondary | ICD-10-CM | POA: Diagnosis not present

## 2022-01-10 DIAGNOSIS — J441 Chronic obstructive pulmonary disease with (acute) exacerbation: Secondary | ICD-10-CM

## 2022-01-10 DIAGNOSIS — Z7984 Long term (current) use of oral hypoglycemic drugs: Secondary | ICD-10-CM

## 2022-01-10 DIAGNOSIS — E43 Unspecified severe protein-calorie malnutrition: Secondary | ICD-10-CM | POA: Diagnosis not present

## 2022-01-10 DIAGNOSIS — J9601 Acute respiratory failure with hypoxia: Secondary | ICD-10-CM | POA: Diagnosis present

## 2022-01-10 DIAGNOSIS — Z681 Body mass index (BMI) 19 or less, adult: Secondary | ICD-10-CM | POA: Diagnosis not present

## 2022-01-10 DIAGNOSIS — Z79899 Other long term (current) drug therapy: Secondary | ICD-10-CM | POA: Diagnosis not present

## 2022-01-10 DIAGNOSIS — R64 Cachexia: Secondary | ICD-10-CM | POA: Diagnosis not present

## 2022-01-10 DIAGNOSIS — J449 Chronic obstructive pulmonary disease, unspecified: Secondary | ICD-10-CM | POA: Diagnosis present

## 2022-01-10 DIAGNOSIS — I2510 Atherosclerotic heart disease of native coronary artery without angina pectoris: Secondary | ICD-10-CM | POA: Diagnosis present

## 2022-01-10 DIAGNOSIS — Z7982 Long term (current) use of aspirin: Secondary | ICD-10-CM

## 2022-01-10 DIAGNOSIS — E119 Type 2 diabetes mellitus without complications: Secondary | ICD-10-CM

## 2022-01-10 DIAGNOSIS — E871 Hypo-osmolality and hyponatremia: Secondary | ICD-10-CM | POA: Diagnosis present

## 2022-01-10 DIAGNOSIS — E785 Hyperlipidemia, unspecified: Secondary | ICD-10-CM | POA: Diagnosis present

## 2022-01-10 DIAGNOSIS — Z20822 Contact with and (suspected) exposure to covid-19: Secondary | ICD-10-CM | POA: Diagnosis present

## 2022-01-10 DIAGNOSIS — J439 Emphysema, unspecified: Principal | ICD-10-CM | POA: Diagnosis present

## 2022-01-10 DIAGNOSIS — F17210 Nicotine dependence, cigarettes, uncomplicated: Secondary | ICD-10-CM | POA: Diagnosis not present

## 2022-01-10 DIAGNOSIS — R7989 Other specified abnormal findings of blood chemistry: Secondary | ICD-10-CM | POA: Diagnosis present

## 2022-01-10 DIAGNOSIS — F419 Anxiety disorder, unspecified: Secondary | ICD-10-CM | POA: Insufficient documentation

## 2022-01-10 DIAGNOSIS — E1169 Type 2 diabetes mellitus with other specified complication: Secondary | ICD-10-CM | POA: Diagnosis present

## 2022-01-10 DIAGNOSIS — Z720 Tobacco use: Secondary | ICD-10-CM | POA: Diagnosis present

## 2022-01-10 DIAGNOSIS — R9431 Abnormal electrocardiogram [ECG] [EKG]: Secondary | ICD-10-CM | POA: Diagnosis not present

## 2022-01-10 DIAGNOSIS — R778 Other specified abnormalities of plasma proteins: Secondary | ICD-10-CM | POA: Diagnosis present

## 2022-01-10 DIAGNOSIS — I6522 Occlusion and stenosis of left carotid artery: Secondary | ICD-10-CM | POA: Diagnosis present

## 2022-01-10 DIAGNOSIS — R0602 Shortness of breath: Secondary | ICD-10-CM | POA: Diagnosis not present

## 2022-01-10 LAB — CBC WITH DIFFERENTIAL/PLATELET
Abs Immature Granulocytes: 0.04 10*3/uL (ref 0.00–0.07)
Basophils Absolute: 0 10*3/uL (ref 0.0–0.1)
Basophils Relative: 1 %
Eosinophils Absolute: 0.1 10*3/uL (ref 0.0–0.5)
Eosinophils Relative: 1 %
HCT: 40.3 % (ref 39.0–52.0)
Hemoglobin: 12 g/dL — ABNORMAL LOW (ref 13.0–17.0)
Immature Granulocytes: 1 %
Lymphocytes Relative: 16 %
Lymphs Abs: 1.1 10*3/uL (ref 0.7–4.0)
MCH: 27.3 pg (ref 26.0–34.0)
MCHC: 29.8 g/dL — ABNORMAL LOW (ref 30.0–36.0)
MCV: 91.6 fL (ref 80.0–100.0)
Monocytes Absolute: 0.6 10*3/uL (ref 0.1–1.0)
Monocytes Relative: 9 %
Neutro Abs: 5 10*3/uL (ref 1.7–7.7)
Neutrophils Relative %: 72 %
Platelets: 196 10*3/uL (ref 150–400)
RBC: 4.4 MIL/uL (ref 4.22–5.81)
RDW: 14.2 % (ref 11.5–15.5)
WBC: 6.9 10*3/uL (ref 4.0–10.5)
nRBC: 0 % (ref 0.0–0.2)

## 2022-01-10 LAB — I-STAT VENOUS BLOOD GAS, ED
Acid-Base Excess: 1 mmol/L (ref 0.0–2.0)
Bicarbonate: 27.8 mmol/L (ref 20.0–28.0)
Calcium, Ion: 1.23 mmol/L (ref 1.15–1.40)
HCT: 43 % (ref 39.0–52.0)
Hemoglobin: 14.6 g/dL (ref 13.0–17.0)
O2 Saturation: 71 %
Potassium: 4.1 mmol/L (ref 3.5–5.1)
Sodium: 133 mmol/L — ABNORMAL LOW (ref 135–145)
TCO2: 29 mmol/L (ref 22–32)
pCO2, Ven: 53.9 mmHg (ref 44–60)
pH, Ven: 7.321 (ref 7.25–7.43)
pO2, Ven: 41 mmHg (ref 32–45)

## 2022-01-10 LAB — RESP PANEL BY RT-PCR (FLU A&B, COVID) ARPGX2
Influenza A by PCR: NEGATIVE
Influenza B by PCR: NEGATIVE
SARS Coronavirus 2 by RT PCR: NEGATIVE

## 2022-01-10 LAB — TROPONIN I (HIGH SENSITIVITY)
Troponin I (High Sensitivity): 68 ng/L — ABNORMAL HIGH (ref <18)
Troponin I (High Sensitivity): 83 ng/L — ABNORMAL HIGH (ref <18)

## 2022-01-10 LAB — BASIC METABOLIC PANEL
Anion gap: 11 (ref 5–15)
BUN: 11 mg/dL (ref 8–23)
CO2: 23 mmol/L (ref 22–32)
Calcium: 9.5 mg/dL (ref 8.9–10.3)
Chloride: 99 mmol/L (ref 98–111)
Creatinine, Ser: 0.7 mg/dL (ref 0.61–1.24)
GFR, Estimated: >60 mL/min (ref >60)
Glucose, Bld: 95 mg/dL (ref 70–99)
Potassium: 3.9 mmol/L (ref 3.5–5.1)
Sodium: 133 mmol/L — ABNORMAL LOW (ref 135–145)

## 2022-01-10 LAB — BRAIN NATRIURETIC PEPTIDE: B Natriuretic Peptide: 26.8 pg/mL (ref 0.0–100.0)

## 2022-01-10 MED ORDER — ALBUTEROL SULFATE (2.5 MG/3ML) 0.083% IN NEBU: 2.5000 mg | INHALATION_SOLUTION | RESPIRATORY_TRACT | Status: DC | PRN

## 2022-01-10 MED ORDER — INSULIN ASPART 100 UNIT/ML IJ SOLN
0.0000 [IU] | Freq: Three times a day (TID) | INTRAMUSCULAR | Status: DC
  Administered 2022-01-11: 2 [IU] via SUBCUTANEOUS

## 2022-01-10 MED ORDER — FAMOTIDINE 20 MG PO TABS
20.0000 mg | ORAL_TABLET | Freq: Every day | ORAL | Status: DC | PRN
  Administered 2022-01-12: 20 mg via ORAL
  Filled 2022-01-10: qty 1

## 2022-01-10 MED ORDER — SODIUM CHLORIDE 0.9 % IV SOLN
500.0000 mg | INTRAVENOUS | Status: AC
  Administered 2022-01-10: 500 mg via INTRAVENOUS
  Filled 2022-01-10: qty 5

## 2022-01-10 MED ORDER — ACETAMINOPHEN 650 MG RE SUPP: 650.0000 mg | Freq: Four times a day (QID) | RECTAL | Status: DC | PRN

## 2022-01-10 MED ORDER — NICOTINE 21 MG/24HR TD PT24
21.0000 mg | MEDICATED_PATCH | Freq: Every day | TRANSDERMAL | Status: DC
  Administered 2022-01-10 – 2022-01-13 (×4): 21 mg via TRANSDERMAL
  Filled 2022-01-10 (×4): qty 1

## 2022-01-10 MED ORDER — AZITHROMYCIN 250 MG PO TABS
500.0000 mg | ORAL_TABLET | Freq: Every day | ORAL | Status: DC
  Administered 2022-01-11 – 2022-01-13 (×3): 500 mg via ORAL
  Filled 2022-01-10 (×3): qty 2

## 2022-01-10 MED ORDER — METHYLPREDNISOLONE SODIUM SUCC 40 MG IJ SOLR
40.0000 mg | Freq: Two times a day (BID) | INTRAMUSCULAR | Status: DC
  Administered 2022-01-11 – 2022-01-13 (×5): 40 mg via INTRAVENOUS
  Filled 2022-01-10 (×5): qty 1

## 2022-01-10 MED ORDER — ENOXAPARIN SODIUM 40 MG/0.4ML IJ SOSY
40.0000 mg | PREFILLED_SYRINGE | INTRAMUSCULAR | Status: DC
  Administered 2022-01-10 – 2022-01-12 (×3): 40 mg via SUBCUTANEOUS
  Filled 2022-01-10 (×3): qty 0.4

## 2022-01-10 MED ORDER — ATORVASTATIN CALCIUM 10 MG PO TABS
10.0000 mg | ORAL_TABLET | Freq: Every day | ORAL | Status: DC
  Administered 2022-01-11 – 2022-01-13 (×3): 10 mg via ORAL
  Filled 2022-01-10 (×4): qty 1

## 2022-01-10 MED ORDER — ACETAMINOPHEN 325 MG PO TABS: 650.0000 mg | ORAL_TABLET | Freq: Four times a day (QID) | ORAL | Status: DC | PRN

## 2022-01-10 MED ORDER — IPRATROPIUM-ALBUTEROL 0.5-2.5 (3) MG/3ML IN SOLN
3.0000 mL | Freq: Once | RESPIRATORY_TRACT | Status: AC
  Administered 2022-01-10: 3 mL via RESPIRATORY_TRACT
  Filled 2022-01-10: qty 3

## 2022-01-10 MED ORDER — ONDANSETRON HCL 4 MG PO TABS: 4.0000 mg | ORAL_TABLET | Freq: Four times a day (QID) | ORAL | Status: DC | PRN

## 2022-01-10 MED ORDER — METHYLPREDNISOLONE SODIUM SUCC 125 MG IJ SOLR
125.0000 mg | Freq: Once | INTRAMUSCULAR | Status: AC
  Administered 2022-01-10: 125 mg via INTRAVENOUS
  Filled 2022-01-10: qty 2

## 2022-01-10 MED ORDER — ONDANSETRON HCL 4 MG/2ML IJ SOLN: 4.0000 mg | Freq: Four times a day (QID) | INTRAMUSCULAR | Status: DC | PRN

## 2022-01-10 MED ORDER — IPRATROPIUM-ALBUTEROL 0.5-2.5 (3) MG/3ML IN SOLN
3.0000 mL | Freq: Four times a day (QID) | RESPIRATORY_TRACT | Status: DC
  Administered 2022-01-11 – 2022-01-12 (×5): 3 mL via RESPIRATORY_TRACT
  Filled 2022-01-10 (×5): qty 3

## 2022-01-10 MED ORDER — ASPIRIN EC 81 MG PO TBEC
81.0000 mg | DELAYED_RELEASE_TABLET | Freq: Every day | ORAL | Status: DC
  Administered 2022-01-11 – 2022-01-13 (×3): 81 mg via ORAL
  Filled 2022-01-10 (×3): qty 1

## 2022-01-10 NOTE — Assessment & Plan Note (Signed)
Hold home metformin and place on SSI. 

## 2022-01-10 NOTE — Assessment & Plan Note (Signed)
Minimally elevated troponin 68 >> 83.  Patient denies any chest pain.  Suspect demand ischemia in setting of acute COPD exacerbation.  Repeat troponin. ?

## 2022-01-10 NOTE — H&P (Signed)
?History and Physical  ? ? ?KIMM UNGARO WLS:[id number] DOB: 1956-06-04 DOA: 01/10/2022 ? ?PCP: Wenda Low, MD  ?Patient coming from: Home ? ?I have personally briefly reviewed patient's old medical records in [place] ? ?Chief Complaint: Shortness of breath ? ?HPI: ?Jose Welch is a 66 y.o. male with medical history significant for COPD, T2DM, HLD, RBBB, CAS s/p Lt CEA, Anxiety who presented to the ED for evaluation of shortness of breath. ? ?Patient states he has been having progressive shortness of breath over the last 4 days, he feels this has been triggered by pollen.  He has a new cough productive of clear sputum.  He has been using albuterol rescue inhaler without significant improvement.  He went to see his doctor today and was noted to have SPO2 in the low 80s while on room air and was subsequently directed to the ED for further evaluation. ? ?Patient otherwise denies any chest pain, fevers, chills, diaphoresis, nausea, vomiting.  He reports ongoing tobacco use, at least 1 pack/day. ? ?ED Course  Labs/Imaging on admission: I have personally reviewed following labs and imaging studies. ? ?Initial vitals showed BP 138/97, pulse 94, RR 22, temp 98.3 ?F, SPO2 89% on room air. ? ?Labs show WBC 6.9, hemoglobin 12.0, platelets 196,000, sodium 133, potassium 3.9, bicarb 23, BUN 11, creatinine 0.70, serum glucose 95, BNP 26.8, troponin 68 > 83. ? ?SARS-CoV-2 and influenza PCR negative. ? ?2 view chest x-ray shows emphysematous changes without focal consolidation, edema, effusion. ? ?Patient was given IV Solu-Medrol 125 mg, DuoNeb x2.  The hospitalist service was consulted to admit for further evaluation and management. ? ?Review of Systems: All systems reviewed and are negative except as documented in history of present illness above. ? ? ?Past Medical History:  ?Diagnosis Date  ? Anxiety   ? Arthritis   ? Right wrist  ? Carotid artery occlusion   ? Carotid stenosis, left   ? COPD (chronic  obstructive pulmonary disease) ([place])   ? Diabetes mellitus without complication ([place])   ? Type II  ? GERD (gastroesophageal reflux disease)   ? History of hiatal hernia   ? ? ?Past Surgical History:  ?Procedure Laterality Date  ? COLONOSCOPY W/ POLYPECTOMY    ? COLONOSCOPY WITH PROPOFOL N/A 07/18/2015  ? Procedure: COLONOSCOPY WITH PROPOFOL;  Surgeon: Jose Fair, MD;  Location: [facility];  Service: Endoscopy;  Laterality: N/A;  ? ENDARTERECTOMY Left 08/30/2017  ? Procedure: ENDARTERECTOMY CAROTID LEFT;  Surgeon: Jose Mitchell, MD;  Location: [facility] OR;  Service: Vascular;  Laterality: Left;  ? TONSILLECTOMY    ? WISDOM TOOTH EXTRACTION    ? ? ?Social History: ? reports that he has been smoking cigarettes. He has been smoking an average of 1 pack per day. He has never used smokeless tobacco. He reports current alcohol use. He reports that he does not use drugs. ? ?No Known Allergies ? ?History reviewed. No pertinent family history. ? ? ?Prior to Admission medications   ?Medication Sig Start Date End Date Taking? Authorizing Provider  ?albuterol (VENTOLIN HFA) 108 (90 Base) MCG/ACT inhaler  04/29/20   [provider]  ?ALPRAZolam Duanne Moron) 0.25 MG tablet  05/22/20   [provider]  ?aspirin EC 81 MG tablet Take 81 mg by mouth daily.    [provider]  ?atorvastatin (LIPITOR) 10 MG tablet Take 10 mg by mouth daily at 6 PM.  07/24/17   [provider]  ?diclofenac Sodium (VOLTAREN) 1 %  GEL Apply 4 g topically 4 (four) times daily. 09/25/21   Jose Sams, PA-C  ?famotidine (PEPCID) 20 MG tablet Take 20 mg by mouth 2 (two) times daily.    [provider]  ?fluticasone Asencion Islam) 50 MCG/ACT nasal spray  04/25/20   [provider]  ?loratadine (CLARITIN) 10 MG tablet 1 tablet    [provider]  ?meloxicam (MOBIC) 15 MG tablet Take 1 tablet (15 mg total) by mouth daily. 04/26/16   Jose Welch, DPM  ?metFORMIN (GLUCOPHAGE) 500 MG tablet Take 500 mg  by mouth daily with breakfast.  07/29/17   [provider]  ?ONE TOUCH ULTRA TEST test strip  07/30/17   [provider]  ?Jose Welch LANCETS FINE MISC  08/01/17   [provider]  ? ? ?Physical Exam: ?Vitals:  ? 01/10/22 1830 01/10/22 1900 01/10/22 1915 01/10/22 1930  ?BP: (!) 150/85 134/88 132/78 140/83  ?Pulse: 89 81 90 (!) 116  ?Resp: (!) '21 14 19 20  '$ ?Temp:      ?TempSrc:      ?SpO2: 90% 97% 99% 97%  ? ?Constitutional: Thin man resting in bed with head elevated.  NAD, calm, comfortable ?Eyes: EOMI, lids and conjunctivae normal ?ENMT: Mucous membranes are moist. Posterior pharynx clear of any exudate or lesions.Normal dentition.  ?Neck: normal, supple, no masses. ?Respiratory: Distant breath sounds with faint end expiratory wheezing. Normal respiratory effort while on 2 L O2 via [place]. No accessory muscle use.  ?Cardiovascular: Regular rate and rhythm, no murmurs / rubs / gallops. No extremity edema. 2+ pedal pulses. ?Abdomen: no tenderness, no masses palpated. No hepatosplenomegaly. Bowel sounds positive.  ?Musculoskeletal: no clubbing / cyanosis. No joint deformity upper and lower extremities. Good ROM, no contractures. Normal muscle tone.  ?Skin: no rashes, lesions, ulcers. No induration ?Neurologic: Sensation intact. Strength 5/5 in all 4.  ?Psychiatric: Normal judgment and insight. Alert and oriented x 3. Normal mood.  ? ?EKG: Personally reviewed. Sinus rhythm, rate 99, RBBB, biatrial enlargement.  Rate is faster and RBBB new when compared to prior. ? ?Assessment/Plan ?Principal Problem: ?  COPD with acute exacerbation ([place]) ?Active Problems: ?  Elevated troponin ?  Type 2 diabetes mellitus ([place]) ?  Carotid stenosis, left ?  Hyperlipidemia associated with type 2 diabetes mellitus ([place]) ?  Tobacco use ?  ?Jose Welch is a 66 y.o. male with medical history significant for COPD, T2DM, HLD, RBBB, CAS s/p Lt CEA, Anxiety who is admitted for acute COPD exacerbation. ? ?Assessment  and Plan: ?* COPD with acute exacerbation ([place]) ?-Continue IV Solu-Medrol 40 mg twice daily ?-Continue scheduled DuoNebs with albuterol as needed ?-Supplemental oxygen as needed ?-Started on azithromycin ? ?Elevated troponin ?Minimally elevated troponin 68 >> 83.  Patient denies any chest pain.  Suspect demand ischemia in setting of acute COPD exacerbation.  Repeat troponin. ? ?Type 2 diabetes mellitus ([place]) ?Hold home metformin and place on SSI. ? ?Tobacco use ?Reports smoking 1 pack/day.  Smoking cessation advised.  Nicotine patch ordered. ? ?Hyperlipidemia associated with type 2 diabetes mellitus ([place]) ?Continue atorvastatin. ? ?Carotid stenosis, left ?S/p left CEA.  Continue aspirin and statin. ? ?DVT prophylaxis: enoxaparin (LOVENOX) injection 40 mg Start: 01/10/22 2045 ?Code Status: Full code, confirmed on admission. ?Family Communication: Discussed with patient, he has discussed with family ?Disposition Plan: From home and likely discharge to home pending clinical progress ?Consults called: None ?Severity of Illness: ?The appropriate patient status for this patient is OBSERVATION. Observation status is judged  to be reasonable and necessary in order to provide the required intensity of service to ensure the patient's safety. The patient's presenting symptoms, physical exam findings, and initial radiographic and laboratory data in the context of their medical condition is felt to place them at decreased risk for further clinical deterioration. Furthermore, it is anticipated that the patient will be medically stable for discharge from the hospital within 2 midnights of admission.   ?Zada Finders MD ?[facility] ? ?If 7PM-7AM, please contact night-coverage ?[email or web address] ? ?01/10/2022, 8:38 PM  ?

## 2022-01-10 NOTE — ED Provider Triage Note (Signed)
Emergency Medicine Provider Triage Evaluation Note ? ?Jose Welch , a 66 y.o. adult  was evaluated in triage.  Pt complains of weakness and shortness of breath for several days.  Patient evaluated at PCP office and found to be hypoxic and referred to the emergency room.  Denies fever, chills, chest pain.. ? ?Review of Systems  ?Positive: As above ?Negative: As above ? ?Physical Exam  ?BP (!) 138/97 (BP Location: Right Arm)   Pulse 94   Temp 98.3 ?F (36.8 ?C) (Oral)   Resp (!) 22   SpO2 (!) 89%  ?Gen:   Awake, no distress   ?Resp:  Normal effort  ?MSK:   Moves extremities without difficulty  ?Other:  Diminished lungs sounds ? ?Medical Decision Making  ?Medically screening exam initiated at 2:39 PM.  Appropriate orders placed.  Jose Welch was informed that the remainder of the evaluation will be completed by another provider, this initial triage assessment does not replace that evaluation, and the importance of remaining in the ED until their evaluation is complete. ? ? ?  ?Evlyn Courier, PA-C ?01/10/22 1440 ? ?

## 2022-01-10 NOTE — ED Provider Notes (Addendum)
?[facility] ?Provider Note ? ? ?CSN: [id number] ?Arrival date & time: 01/10/22  1338 ? ?  ? ?History ? ?Chief Complaint  ?Patient presents with  ? Fatigue  ? Shortness of Breath  ? ? ?Jose Welch is a 66 y.o. male with history of COPD, CAD, DM, and carotid endarterectomy who presents with concern for SOB.  States that he has had progressively worsening shortness of breath for the last month without fevers, chills, or other symptoms.  States at baseline he is somewhat productive cough with mucus.  States he was seen by the nurse in his doctor's office today and due to low oxygen in the 80s on room air was directed to the emergency department.  Patient states he is not on any oxygen at home. ? ?Patient does smoke at least 1 pack of cigarettes per day.  Denies any weight loss. Minimal improvement following use of rescue inhaler at home.  ? ?In addition to the above listed history patient with history of GERD, anxiety, and polypectomy at time of colonoscopy. ?HPI ? ?  ? ?Home Medications ?Prior to Admission medications   ?Medication Sig Start Date End Date Taking? Authorizing Provider  ?albuterol (VENTOLIN HFA) 108 (90 Base) MCG/ACT inhaler  04/29/20   [provider]  ?ALPRAZolam Duanne Moron) 0.25 MG tablet  05/22/20   [provider]  ?aspirin EC 81 MG tablet Take 81 mg by mouth daily.    [provider]  ?atorvastatin (LIPITOR) 10 MG tablet Take 10 mg by mouth daily at 6 PM.  07/24/17   [provider]  ?diclofenac Sodium (VOLTAREN) 1 % GEL Apply 4 g topically 4 (four) times daily. 09/25/21   Hazel Sams, PA-C  ?famotidine (PEPCID) 20 MG tablet Take 20 mg by mouth 2 (two) times daily.    [provider]  ?fluticasone Asencion Islam) 50 MCG/ACT nasal spray  04/25/20   [provider]  ?loratadine (CLARITIN) 10 MG tablet 1 tablet    [provider]  ?meloxicam (MOBIC) 15 MG tablet Take 1 tablet (15 mg total) by mouth daily.  04/26/16   Trula Slade, DPM  ?metFORMIN (GLUCOPHAGE) 500 MG tablet Take 500 mg by mouth daily with breakfast.  07/29/17   [provider]  ?ONE TOUCH ULTRA TEST test strip  07/30/17   [provider]  ?Jonetta Speak LANCETS FINE MISC  08/01/17   [provider]  ?   ? ?Allergies    ?Patient has no known allergies.   ? ?Review of Systems   ?Review of Systems  ?Constitutional: Negative.   ?HENT: Negative.    ?Respiratory:  Positive for choking and shortness of breath.   ?Cardiovascular: Negative.   ?Gastrointestinal: Negative.   ?Genitourinary: Negative.   ?Musculoskeletal: Negative.   ?Neurological: Negative.   ? ?Physical Exam ?Updated Vital Signs ?BP (!) 150/85   Pulse 89   Temp 98.3 ?F (36.8 ?C) (Oral)   Resp (!) 21   SpO2 90%  ?Physical Exam ?Vitals and nursing note reviewed.  ?Constitutional:   ?   Appearance: He is cachectic. He is not ill-appearing or toxic-appearing.  ?   Interventions: Nasal cannula in place.  ?   Comments: On 2.5 L supplemental O2   nasal cannula at time my arrival to the room.  This was titrated down by this provider to 1 L by nasal cannula with maintenance of saturations.  ?HENT:  ?   Head: Normocephalic and atraumatic.  ?  Nose: Nose normal.  ?   Mouth/Throat:  ?   Mouth: Mucous membranes are moist.  ?   Pharynx: Oropharynx is clear. Uvula midline. No oropharyngeal exudate or posterior oropharyngeal erythema.  ?Eyes:  ?   General:     ?   Right eye: No discharge.     ?   Left eye: No discharge.  ?   Conjunctiva/sclera: Conjunctivae normal.  ?Neck:  ?   Trachea: Trachea and phonation normal.  ?Cardiovascular:  ?   Rate and Rhythm: Normal rate and regular rhythm.  ?   Pulses: Normal pulses.  ?   Heart sounds: Normal heart sounds. No murmur heard. ?Pulmonary:  ?   Effort: Accessory muscle usage and prolonged expiration present. No tachypnea, bradypnea, respiratory distress or retractions.  ?   Breath sounds: Decreased air movement present. Examination  of the right-middle field reveals decreased breath sounds. Examination of the left-middle field reveals decreased breath sounds. Examination of the right-lower field reveals decreased breath sounds. Examination of the left-lower field reveals decreased breath sounds. Decreased breath sounds present. No wheezing or rales.  ?   Comments: Some tracheal tugging while speaking ?Chest:  ?   Chest wall: No mass, lacerations, deformity, swelling, tenderness, crepitus or edema.  ?Abdominal:  ?   General: Bowel sounds are normal. There is no distension.  ?   Palpations: Abdomen is soft.  ?   Tenderness: There is no abdominal tenderness. There is no right CVA tenderness, left CVA tenderness, guarding or rebound.  ?Musculoskeletal:     ?   General: No deformity.  ?   Cervical back: Normal range of motion and neck supple.  ?   Right lower leg: No edema.  ?   Left lower leg: No edema.  ?Lymphadenopathy:  ?   Cervical: No cervical adenopathy.  ?Skin: ?   General: Skin is warm and dry.  ?   Capillary Refill: Capillary refill takes less than 2 seconds.  ?Neurological:  ?   Mental Status: He is alert. Mental status is at baseline.  ?Psychiatric:     ?   Mood and Affect: Mood normal.  ? ? ?ED Results / Procedures / Treatments   ?Labs ?(all labs ordered are listed, but only abnormal results are displayed) ?Labs Reviewed  ?CBC WITH DIFFERENTIAL/PLATELET - Abnormal; Notable for the following components:  ?    Result Value  ? Hemoglobin 12.0 (*)   ? MCHC 29.8 (*)   ? All other components within normal limits  ?BASIC METABOLIC PANEL - Abnormal; Notable for the following components:  ? Sodium 133 (*)   ? All other components within normal limits  ?I-STAT VENOUS BLOOD GAS, ED - Abnormal; Notable for the following components:  ? Sodium 133 (*)   ? All other components within normal limits  ?TROPONIN I (HIGH SENSITIVITY) - Abnormal; Notable for the following components:  ? Troponin I (High Sensitivity) 68 (*)   ? All other components within  normal limits  ?TROPONIN I (HIGH SENSITIVITY) - Abnormal; Notable for the following components:  ? Troponin I (High Sensitivity) 83 (*)   ? All other components within normal limits  ?RESP PANEL BY RT-PCR (FLU A&B, COVID) ARPGX2  ?BRAIN NATRIURETIC PEPTIDE  ? ? ?EKG ?None ? ?Radiology ?DG Chest 2 View ? ?Result Date: 01/10/2022 ?CLINICAL DATA:  Hypoxia.  Shortness of breath. EXAM: CHEST - 2 VIEW COMPARISON:  Radiographs 04/14/2021.  CT 11/14/2021. FINDINGS: Mild patient rotation to the left. The heart size and  mediastinal contours are stable with aortic atherosclerosis. Underlying pectus deformity of the sternum noted. The lungs are hyperinflated with stable scattered mild linear scarring. No edema, confluent airspace opacity, pleural effusion or pneumothorax. No acute osseous findings are evident. IMPRESSION: Chronic obstructive pulmonary disease and emphysema. No evidence of acute cardiopulmonary process. Electronically Signed   By: Richardean Sale M.D.   On: 01/10/2022 15:20   ? ?Procedures ?Procedures  ? ?Medications Ordered in ED ?Medications  ?ipratropium-albuterol (DUONEB) 0.5-2.5 (3) MG/3ML nebulizer solution 3 mL (3 mLs Nebulization Given 01/10/22 1636)  ?methylPREDNISolone sodium succinate (SOLU-MEDROL) 125 mg/2 mL injection 125 mg (125 mg Intravenous Given 01/10/22 1633)  ?ipratropium-albuterol (DUONEB) 0.5-2.5 (3) MG/3ML nebulizer solution 3 mL (3 mLs Nebulization Given 01/10/22 1857)  ? ? ?ED Course/ Medical Decision Making/ A&P ?Clinical Course as of 01/10/22 1907  ?Wed Jan 10, 2022  ?1838 Consult Dr. Terri Skains, cardiology, who suspects demand ischemia; no cardiology intervention warranted at this time. He remains available for consult if necessary in the inpatient setting.  [RS]  ?1903 Consult to Dr. Posey Pronto, hospitalist, was agreeable to seeing this patient admitting him to his service.  Appreciate his collaboration of care of this patient [RS]  ?  ?Clinical Course User Index ?[RS] Rajesh Wyss, Gypsy Balsam, PA-C   ? ?                        ?Medical Decision Making ?66 year old male with history of COPD who presents with concern for 1 month of progressive worsening shortness of breath.   ? ?Hypoxic to 89% on room air

## 2022-01-10 NOTE — Assessment & Plan Note (Signed)
Continue atorvastatin

## 2022-01-10 NOTE — Assessment & Plan Note (Signed)
Reports smoking 1 pack/day.  Smoking cessation advised.  Nicotine patch ordered. ?

## 2022-01-10 NOTE — Assessment & Plan Note (Signed)
S/p left CEA.  Continue aspirin and statin. ?

## 2022-01-10 NOTE — Hospital Course (Signed)
Jose Welch is a 66 y.o. male with medical history significant for COPD, T2DM, HLD, RBBB, CAS s/p Lt CEA, Anxiety who is admitted for acute COPD exacerbation. ?

## 2022-01-10 NOTE — Assessment & Plan Note (Signed)
-  Continue IV Solu-Medrol 40 mg twice daily ?-Continue scheduled DuoNebs with albuterol as needed ?-Supplemental oxygen as needed ?-Started on azithromycin ?

## 2022-01-10 NOTE — ED Triage Notes (Signed)
Patient sent here from pcp office due to sob, low spo2, sent for possible pneumonia. Pt has hx of copd, does not wear 02 at home. Speaking in full sentences at triage, spo2 89%.  ?

## 2022-01-11 DIAGNOSIS — J441 Chronic obstructive pulmonary disease with (acute) exacerbation: Secondary | ICD-10-CM | POA: Diagnosis not present

## 2022-01-11 DIAGNOSIS — F419 Anxiety disorder, unspecified: Secondary | ICD-10-CM | POA: Diagnosis present

## 2022-01-11 DIAGNOSIS — E785 Hyperlipidemia, unspecified: Secondary | ICD-10-CM | POA: Diagnosis present

## 2022-01-11 DIAGNOSIS — I6522 Occlusion and stenosis of left carotid artery: Secondary | ICD-10-CM | POA: Diagnosis present

## 2022-01-11 DIAGNOSIS — F17210 Nicotine dependence, cigarettes, uncomplicated: Secondary | ICD-10-CM | POA: Diagnosis present

## 2022-01-11 DIAGNOSIS — J439 Emphysema, unspecified: Secondary | ICD-10-CM | POA: Diagnosis present

## 2022-01-11 DIAGNOSIS — E1169 Type 2 diabetes mellitus with other specified complication: Secondary | ICD-10-CM | POA: Diagnosis present

## 2022-01-11 DIAGNOSIS — E871 Hypo-osmolality and hyponatremia: Secondary | ICD-10-CM | POA: Diagnosis present

## 2022-01-11 DIAGNOSIS — Z79899 Other long term (current) drug therapy: Secondary | ICD-10-CM | POA: Diagnosis not present

## 2022-01-11 DIAGNOSIS — E43 Unspecified severe protein-calorie malnutrition: Secondary | ICD-10-CM | POA: Diagnosis present

## 2022-01-11 DIAGNOSIS — Z7982 Long term (current) use of aspirin: Secondary | ICD-10-CM | POA: Diagnosis not present

## 2022-01-11 DIAGNOSIS — Z7984 Long term (current) use of oral hypoglycemic drugs: Secondary | ICD-10-CM | POA: Diagnosis not present

## 2022-01-11 DIAGNOSIS — Z681 Body mass index (BMI) 19 or less, adult: Secondary | ICD-10-CM | POA: Diagnosis not present

## 2022-01-11 DIAGNOSIS — I2510 Atherosclerotic heart disease of native coronary artery without angina pectoris: Secondary | ICD-10-CM | POA: Diagnosis present

## 2022-01-11 DIAGNOSIS — Z20822 Contact with and (suspected) exposure to covid-19: Secondary | ICD-10-CM | POA: Diagnosis present

## 2022-01-11 DIAGNOSIS — K219 Gastro-esophageal reflux disease without esophagitis: Secondary | ICD-10-CM | POA: Diagnosis present

## 2022-01-11 DIAGNOSIS — J9601 Acute respiratory failure with hypoxia: Secondary | ICD-10-CM | POA: Diagnosis present

## 2022-01-11 DIAGNOSIS — R64 Cachexia: Secondary | ICD-10-CM | POA: Diagnosis present

## 2022-01-11 DIAGNOSIS — R0602 Shortness of breath: Secondary | ICD-10-CM | POA: Diagnosis present

## 2022-01-11 LAB — GLUCOSE, CAPILLARY
Glucose-Capillary: 145 mg/dL — ABNORMAL HIGH (ref 70–99)
Glucose-Capillary: 157 mg/dL — ABNORMAL HIGH (ref 70–99)
Glucose-Capillary: 274 mg/dL — ABNORMAL HIGH (ref 70–99)

## 2022-01-11 LAB — BASIC METABOLIC PANEL
Anion gap: 10 (ref 5–15)
BUN: 18 mg/dL (ref 8–23)
CO2: 23 mmol/L (ref 22–32)
Calcium: 9.2 mg/dL (ref 8.9–10.3)
Chloride: 101 mmol/L (ref 98–111)
Creatinine, Ser: 0.93 mg/dL (ref 0.61–1.24)
GFR, Estimated: >60 mL/min (ref >60)
Glucose, Bld: 157 mg/dL — ABNORMAL HIGH (ref 70–99)
Potassium: 4.4 mmol/L (ref 3.5–5.1)
Sodium: 134 mmol/L — ABNORMAL LOW (ref 135–145)

## 2022-01-11 LAB — TROPONIN I (HIGH SENSITIVITY): Troponin I (High Sensitivity): 37 ng/L — ABNORMAL HIGH (ref <18)

## 2022-01-11 LAB — CBC
HCT: 38.8 % — ABNORMAL LOW (ref 39.0–52.0)
Hemoglobin: 12.3 g/dL — ABNORMAL LOW (ref 13.0–17.0)
MCH: 27.7 pg (ref 26.0–34.0)
MCHC: 31.7 g/dL (ref 30.0–36.0)
MCV: 87.4 fL (ref 80.0–100.0)
Platelets: 197 10*3/uL (ref 150–400)
RBC: 4.44 MIL/uL (ref 4.22–5.81)
RDW: 14.1 % (ref 11.5–15.5)
WBC: 4.2 10*3/uL (ref 4.0–10.5)
nRBC: 0 % (ref 0.0–0.2)

## 2022-01-11 LAB — HEMOGLOBIN A1C
Hgb A1c MFr Bld: 6.1 % — ABNORMAL HIGH (ref 4.8–5.6)
Mean Plasma Glucose: 128.37 mg/dL

## 2022-01-11 LAB — HIV ANTIBODY (ROUTINE TESTING W REFLEX): HIV Screen 4th Generation wRfx: NONREACTIVE

## 2022-01-11 LAB — CBG MONITORING, ED: Glucose-Capillary: 136 mg/dL — ABNORMAL HIGH (ref 70–99)

## 2022-01-11 MED ORDER — ARFORMOTEROL TARTRATE 15 MCG/2ML IN NEBU
15.0000 ug | INHALATION_SOLUTION | Freq: Two times a day (BID) | RESPIRATORY_TRACT | Status: DC
  Administered 2022-01-11 – 2022-01-12 (×4): 15 ug via RESPIRATORY_TRACT
  Filled 2022-01-11 (×5): qty 2

## 2022-01-11 MED ORDER — INSULIN ASPART 100 UNIT/ML IJ SOLN
0.0000 [IU] | Freq: Three times a day (TID) | INTRAMUSCULAR | Status: DC
  Administered 2022-01-12 – 2022-01-13 (×3): 1 [IU] via SUBCUTANEOUS

## 2022-01-11 MED ORDER — BUDESONIDE 0.5 MG/2ML IN SUSP
0.5000 mg | Freq: Two times a day (BID) | RESPIRATORY_TRACT | Status: DC
  Administered 2022-01-11 – 2022-01-12 (×4): 0.5 mg via RESPIRATORY_TRACT
  Filled 2022-01-11 (×5): qty 2

## 2022-01-11 NOTE — Progress Notes (Signed)
Pt desat to 80% while walking to/ using the bathroom. O2 temporarily increased to 4L [place]. O2 came up to 90%. [place] decreased back down to 3L after pt went back to bed. Respiratory therapist came to give scheduled nebulizers shortly after. Will continue to monitor. ?

## 2022-01-11 NOTE — TOC Progression Note (Signed)
Transition of Care (TOC) - Progression Note  ? ? ?Patient Details  ?Name: Jose Welch ?MRN: [id number] ?Date of Birth: [birth year] ? ?Transition of Care (TOC) CM/SW Contact  ?Zenon Mayo, RN ?Phone Number: ?01/11/2022, 12:36 PM ? ?Clinical Narrative:    ? ?Transition of Care (TOC) Screening Note ? ? ?Patient Details  ?Name: Jose Welch ?Date of Birth: [birth year] ? ? ?Transition of Care (TOC) CM/SW Contact:    ?Zenon Mayo, RN ?Phone Number: ?01/11/2022, 12:36 PM ? ? ? ?Transition of Care Department [facility]) has reviewed patient and no TOC needs have been identified at this time. We will continue to monitor patient advancement through interdisciplinary progression rounds. If new patient transition needs arise, please place a TOC consult. ?  ? ? ?  ?  ? ?Expected Discharge Plan and Services ?  ?  ?  ?  ?  ?                ?  ?  ?  ?  ?  ?  ?  ?  ?  ?  ? ? ?Social Determinants of Health (SDOH) Interventions ?  ? ?Readmission Risk Interventions ?   ? View : No data to display.  ?  ?  ?  ? ? ?

## 2022-01-11 NOTE — Progress Notes (Signed)
?PROGRESS NOTE ? ? ? ?Jose Welch  GMW:[id number] DOB: 07/16/1956 DOA: 01/10/2022 ?PCP: Wenda Low, MD  ? ? ?Brief Narrative:  ? ?Jose Welch is a 66 year old male with past medical history significant for COPD, type 2 diabetes mellitus, hyperlipidemia, RBBB, CAS s/p left CEA, anxiety who presented to [facility] ED on 5/10 for shortness of breath.  Patient was sent from his PCP office due to dyspnea, low SPO2.  Reports progressive shortness of breath over the last 4 days and feels this has been triggered by pollen.  Also reports new cough productive of clear sputum.  Has been utilizing his albuterol rescue inhaler without significant improvement.  As his PCP office he was noted to have SPO2 in the low 80s while on room air and was ultimately directed to the ED for further evaluation.  Denies any chest pain, no fever/chills, no diaphoresis, no nausea/vomiting.  Reports ongoing tobacco use, at least 1 pack/day. ? ?In the ED, temperature 98.3 ?F, HR 94, RR 22, BP 138/97, SPO2 89% on room air.  WBC 6.9, hemoglobin 12.0, platelets 196.  Sodium 133, potassium 3.9, chloride 99, CO2 23, glucose 95.  BNP 26.8.  High sensitive troponin 68>83>37.  COVID-19 PCR negative.  Influenza A/B PCR negative.  Chest x-ray with chronic obstructive pulmonary disease and emphysema, no evidence of acute cardiopulmonary disease process.  Patient was given IV Solu-Medrol 125 mg x 1, DuoNeb's x2.  Hospitalist service consulted for further evaluation and management of acute hypoxic respiratory failure secondary to COPD exacerbation. ? ?Assessment & Plan: ?  ?Acute hypoxic respiratory failure, POA ?COPD exacerbation ?Patient presenting to ED by discretion of PCP for hypoxia with increasing shortness of breath for the last 4 days with associated clear sputum production.  Patient is afebrile without leukocytosis.  Chest x-ray with no acute cardiopulmonary disease process.  Patient continues to endorse tobacco abuse smoking 1  pack/day. ?--Azithromycin 500 mg p.o. daily x5 days ?--Brovana neb twice daily ?--Pulmicort neb twice daily ?--DuoNeb scheduled every 6 hours ?--Solu-Medrol 40 mg IV every 12 hours ?--Continue supple oxygen, maintain SPO2 greater than 88% ?--Daily ambulatory O2 screens ? ?Elevated troponin ?Minimally elevated upon admission, 68 followed by 83 followed by 37.  Denies chest pain.  EKG without concerning findings.  Suspect type II demand ischemia in the setting of acute COPD exacerbation. ? ?Type 2 diabetes mellitus ?Hemoglobin A1c 6.1, well controlled.  On metformin outpatient. ?--Holding oral hypoglycemics while inpatient ?--SSI for coverage ?--CBGs qAC/HS ? ?Hyperlipidemia ?--Atorvastatin 10 mg p.o. daily ? ?Carotid artery stenosis, left ?Previously s/p left CEA. ?--Continue aspirin and statin ? ?Tobacco use disorder ?Continues to endorse smoking 1 pack/day.  Counseled on need for complete cessation. ?--Nicotine patch ? ?Severe protein calorie malnutrition ?Body mass index is 16.37 kg/m?. ?-- Dietitian consultation ? ? ?DVT prophylaxis: enoxaparin (LOVENOX) injection 40 mg Start: 01/10/22 2200 ? ?  Code Status: Full Code ?Family Communication: No family present at bedside this morning ? ?Disposition Plan:  ?Level of care: Med-Surg ?Status is: Inpatient ?Remains inpatient appropriate because: Continues with hypoxia even at rest, continue scheduled nebs, supplemental oxygen, anticipate discharge home in 2-3 days ?  ? ?Consultants:  ?None ? ?Procedures:  ?None ? ?Antimicrobials:  ?Azithromycin 5/10>> ? ? ?Subjective: ?Patient seen examined bedside, resting comfortably.  Remains in ED holding area.  Continues with shortness of breath, requiring 3 L nasal cannula.  Not oxygen dependent at baseline.  Also continues with productive cough with clear sputum and wheezing.  Attempted weaning oxygen  this morning, desaturated again to 78% on room air at rest and oxygen was replaced.  No other specific questions or concerns at  this time.  Denies headache, no fever/chills/night sweats, no nausea/vomiting/diarrhea, no chest pain, no palpitations, no dizziness, no abdominal pain, no weakness, no fatigue, no paresthesias.  No acute events overnight per nurse staff ? ?Objective: ?Vitals:  ? 01/11/22 1200 01/11/22 1214 01/11/22 1216 01/11/22 1324  ?BP:   112/74   ?Pulse:      ?Resp:      ?Temp: 98.4 ?F (36.9 ?C)  97.8 ?F (36.6 ?C)   ?TempSrc: Oral  Oral   ?SpO2:   100% 93%  ?Weight:  47.4 kg    ?Height:  '5\' 7"'$  (1.702 m)    ? ? ?Intake/Output Summary (Last 24 hours) at 01/11/2022 1353 ?Last data filed at 01/11/2022 0645 ?Gross per 24 hour  ?Intake --  ?Output 1200 ml  ?Net -1200 ml  ? ?Filed Weights  ? 01/11/22 1214  ?Weight: 47.4 kg  ? ? ?Examination: ? ?Physical Exam: ?GEN: NAD, alert and oriented x 3, chronically ill/thin/cachectic in appearance ?HEENT: NCAT, PERRL, EOMI, sclera clear, MMM ?PULM: Diffuse mid to late expiratory wheezing throughout all lung fields, no crackles, normal Respaire effort, on 3 L nasal cannula with SPO2 95% at rest ?CV: RRR w/o M/G/R ?GI: abd soft, NTND, NABS, no R/G/M ?MSK: no peripheral edema, muscle strength globally intact 5/5 bilateral upper/lower extremities ?NEURO: CN II-XII intact, no focal deficits, sensation to light touch intact ?PSYCH: normal mood/affect ?Integumentary: dry/intact, no rashes or wounds ? ? ? ?Data Reviewed: I have personally reviewed following labs and imaging studies ? ?CBC: ?Recent Labs  ?Lab 01/10/22 ?1445 01/10/22 ?1653 01/11/22 ?0258  ?WBC 6.9  --  4.2  ?NEUTROABS 5.0  --   --   ?HGB 12.0* 14.6 12.3*  ?HCT 40.3 43.0 38.8*  ?MCV 91.6  --  87.4  ?PLT 196  --  197  ? ?Basic Metabolic Panel: ?Recent Labs  ?Lab 01/10/22 ?1445 01/10/22 ?1653 01/11/22 ?0258  ?NA 133* 133* 134*  ?K 3.9 4.1 4.4  ?CL 99  --  101  ?CO2 23  --  23  ?GLUCOSE 95  --  157*  ?BUN 11  --  18  ?CREATININE 0.70  --  0.93  ?CALCIUM 9.5  --  9.2  ? ?GFR: ?Estimated Creatinine Clearance: 51.7 mL/min (by C-G formula based on  SCr of 0.93 mg/dL). ?Liver Function Tests: ?No results for input(s): AST, ALT, ALKPHOS, BILITOT, PROT, ALBUMIN in the last 168 hours. ?No results for input(s): LIPASE, AMYLASE in the last 168 hours. ?No results for input(s): AMMONIA in the last 168 hours. ?Coagulation Profile: ?No results for input(s): INR, PROTIME in the last 168 hours. ?Cardiac Enzymes: ?No results for input(s): CKTOTAL, CKMB, CKMBINDEX, TROPONINI in the last 168 hours. ?BNP (last 3 results) ?No results for input(s): PROBNP in the last 8760 hours. ?HbA1C: ?Recent Labs  ?  01/11/22 ?0258  ?HGBA1C 6.1*  ? ?CBG: ?Recent Labs  ?Lab 01/11/22 ?0809 01/11/22 ?1215  ?GLUCAP 136* 145*  ? ?Lipid Profile: ?No results for input(s): CHOL, HDL, LDLCALC, TRIG, CHOLHDL, LDLDIRECT in the last 72 hours. ?Thyroid Function Tests: ?No results for input(s): TSH, T4TOTAL, FREET4, T3FREE, THYROIDAB in the last 72 hours. ?Anemia Panel: ?No results for input(s): VITAMINB12, FOLATE, FERRITIN, TIBC, IRON, RETICCTPCT in the last 72 hours. ?Sepsis Labs: ?No results for input(s): PROCALCITON, LATICACIDVEN in the last 168 hours. ? ?Recent Results (from the past 240 hour(s))  ?  Resp Panel by RT-PCR (Flu A&B, Covid) Nasopharyngeal Swab     Status: None  ? Collection Time: 01/10/22  4:09 PM  ? Specimen: Nasopharyngeal Swab; Nasopharyngeal(NP) swabs in vial transport medium  ?Result Value Ref Range Status  ? SARS Coronavirus 2 by RT PCR NEGATIVE NEGATIVE Final  ?  Comment: (NOTE) ?SARS-CoV-2 target nucleic acids are NOT DETECTED. ? ?The SARS-CoV-2 RNA is generally detectable in upper respiratory ?specimens during the acute phase of infection. The lowest ?concentration of SARS-CoV-2 viral copies this assay can detect is ?138 copies/mL. A negative result does not preclude SARS-Cov-2 ?infection and should not be used as the sole basis for treatment or ?other patient management decisions. A negative result may occur with  ?improper specimen collection/handling, submission of specimen  other ?than nasopharyngeal swab, presence of viral mutation(s) within the ?areas targeted by this assay, and inadequate number of viral ?copies(<138 copies/mL). A negative result must be combined with ?clinical observations, p

## 2022-01-11 NOTE — ED Notes (Signed)
Placed pt on 3L 02 per [place]. Dr [place] aware of pt's Sa02 ?

## 2022-01-12 DIAGNOSIS — E43 Unspecified severe protein-calorie malnutrition: Secondary | ICD-10-CM | POA: Insufficient documentation

## 2022-01-12 DIAGNOSIS — J441 Chronic obstructive pulmonary disease with (acute) exacerbation: Secondary | ICD-10-CM | POA: Diagnosis not present

## 2022-01-12 LAB — GLUCOSE, CAPILLARY
Glucose-Capillary: 121 mg/dL — ABNORMAL HIGH (ref 70–99)
Glucose-Capillary: 144 mg/dL — ABNORMAL HIGH (ref 70–99)
Glucose-Capillary: 119 mg/dL — ABNORMAL HIGH (ref 70–99)
Glucose-Capillary: 152 mg/dL — ABNORMAL HIGH (ref 70–99)

## 2022-01-12 MED ORDER — ENSURE ENLIVE PO LIQD
237.0000 mL | Freq: Three times a day (TID) | ORAL | Status: DC
  Administered 2022-01-12: 237 mL via ORAL

## 2022-01-12 MED ORDER — IPRATROPIUM-ALBUTEROL 0.5-2.5 (3) MG/3ML IN SOLN
3.0000 mL | Freq: Two times a day (BID) | RESPIRATORY_TRACT | Status: DC
Start: 2022-01-12 — End: 2022-01-13
  Administered 2022-01-12: 3 mL via RESPIRATORY_TRACT
  Filled 2022-01-12 (×2): qty 3

## 2022-01-12 NOTE — Progress Notes (Signed)
?PROGRESS NOTE ? ? ? ?Jose Welch  CZY:[id number] DOB: [birth year] DOA: 01/10/2022 ?PCP: Wenda Low, MD  ? ? ?Brief Narrative:  ? ?Jose Welch is a 66 year old male with past medical history significant for COPD, type 2 diabetes mellitus, hyperlipidemia, RBBB, CAS s/p left CEA, anxiety who presented to [facility] ED on 5/10 for shortness of breath.  Patient was sent from his PCP office due to dyspnea, low SPO2.  Reports progressive shortness of breath over the last 4 days and feels this has been triggered by pollen.  Also reports new cough productive of clear sputum.  Has been utilizing his albuterol rescue inhaler without significant improvement.  As his PCP office he was noted to have SPO2 in the low 80s while on room air and was ultimately directed to the ED for further evaluation.  Denies any chest pain, no fever/chills, no diaphoresis, no nausea/vomiting.  Reports ongoing tobacco use, at least 1 pack/day. ? ?In the ED, temperature 98.3 ?F, HR 94, RR 22, BP 138/97, SPO2 89% on room air.  WBC 6.9, hemoglobin 12.0, platelets 196.  Sodium 133, potassium 3.9, chloride 99, CO2 23, glucose 95.  BNP 26.8.  High sensitive troponin 68>83>37.  COVID-19 PCR negative.  Influenza A/B PCR negative.  Chest x-ray with chronic obstructive pulmonary disease and emphysema, no evidence of acute cardiopulmonary disease process.  Patient was given IV Solu-Medrol 125 mg x 1, DuoNeb's x2.  Hospitalist service consulted for further evaluation and management of acute hypoxic respiratory failure secondary to COPD exacerbation. ? ?Assessment & Plan: ?  ?Acute hypoxic respiratory failure, POA ?COPD exacerbation ?Patient presenting to ED by discretion of PCP for hypoxia with increasing shortness of breath for the last 4 days with associated clear sputum production.  Patient is afebrile without leukocytosis.  Chest x-ray with no acute cardiopulmonary disease process.  Patient continues to endorse tobacco abuse smoking 1  pack/day. ?--Azithromycin 500 mg p.o. daily x5 days ?--Brovana neb twice daily ?--Pulmicort neb twice daily ?--DuoNeb scheduled every 6 hours ?--Solu-Medrol 40 mg IV every 12 hours ?--Continue supplemental oxygen, maintain SPO2 greater than 88% ?--Daily ambulatory O2 screens ? ?Elevated troponin ?Minimally elevated upon admission, 68 followed by 83 followed by 37.  Denies chest pain.  EKG without concerning findings.  Suspect type II demand ischemia in the setting of acute COPD exacerbation. ? ?Type 2 diabetes mellitus ?Hemoglobin A1c 6.1, well controlled.  On metformin outpatient. ?--Holding oral hypoglycemics while inpatient ?--SSI for coverage ?--CBGs qAC/HS ? ?Hyperlipidemia ?--Atorvastatin 10 mg p.o. daily ? ?Carotid artery stenosis, left ?Previously s/p left CEA. ?--Continue aspirin and statin ? ?Tobacco use disorder ?Continues to endorse smoking 1 pack/day.  Counseled on need for complete cessation. ?--Nicotine patch ? ?Severe protein calorie malnutrition ?Body mass index is 16.24 kg/m?. ?-- Dietitian consultation ? ? ?DVT prophylaxis: enoxaparin (LOVENOX) injection 40 mg Start: 01/10/22 2200 ? ?  Code Status: Full Code ?Family Communication: No family present at bedside this morning ? ?Disposition Plan:  ?Level of care: Med-Surg ?Status is: Inpatient ?Remains inpatient appropriate because: Continues with hypoxia even at rest, continue scheduled nebs, supplemental oxygen, anticipate discharge home in 2-3 days ?  ? ?Consultants:  ?None ? ?Procedures:  ?None ? ?Antimicrobials:  ?Azithromycin 5/10>> ? ? ?Subjective: ?Patient seen examined bedside, resting comfortably.  Sitting at edge of bed eating breakfast.  Agitated that he is still hospitalized.  Discussed with him extensively that he continues to have significant desaturations even at rest and while ambulating to the bathroom last night.  Nursing reports that he desaturated to 60% off of oxygen while utilizing the restroom.  Patient states he wants to go  home.  Discussed with him extensively at bedside this morning that he remains hypoxic and he would need to go home with oxygen or leave [place].  He will contemplate this this morning.  Discussed that his lung disease is significant and may take several days to improve.  He does not understand why this is not improved within 24 hours.  No other questions or concerns at this time.  Denies headache, no chest pain, no palpitations, no abdominal pain, no fever/chills/night sweats, no nausea/vomiting/diarrhea, no weakness, no fatigue, no paresthesias.  No other acute events overnight per nursing staff.  ? ?Objective: ?Vitals:  ? 01/12/22 0312 01/12/22 0529 01/12/22 0721 01/12/22 0830  ?BP:  110/66  (!) 88/54  ?Pulse:  80  94  ?Resp:  20  18  ?Temp:  98 ?F (36.7 ?C)  (!) 97.4 ?F (36.3 ?C)  ?TempSrc:  Oral  Oral  ?SpO2: 94% 94% (!) 88% 92%  ?Weight:  47 kg    ?Height:      ? ? ?Intake/Output Summary (Last 24 hours) at 01/12/2022 0941 ?Last data filed at 01/12/2022 0820 ?Gross per 24 hour  ?Intake 860 ml  ?Output 560 ml  ?Net 300 ml  ? ?Filed Weights  ? 01/11/22 1214 01/12/22 0529  ?Weight: 47.4 kg 47 kg  ? ? ?Examination: ? ?Physical Exam: ?GEN: NAD, alert and oriented x 3, chronically ill/thin/cachectic in appearance ?HEENT: NCAT, PERRL, EOMI, sclera clear, MMM ?PULM: Diffuse mid to late expiratory wheezing throughout all lung fields, no crackles, normal Respaire effort, on 2 L nasal cannula with SPO2 94% at rest ?CV: RRR w/o M/G/R ?GI: abd soft, NTND, NABS, no R/G/M ?MSK: no peripheral edema, muscle strength globally intact 5/5 bilateral upper/lower extremities ?NEURO: CN II-XII intact, no focal deficits, sensation to light touch intact ?PSYCH: normal mood/affect ?Integumentary: dry/intact, no rashes or wounds ? ? ? ?Data Reviewed: I have personally reviewed following labs and imaging studies ? ?CBC: ?Recent Labs  ?Lab 01/10/22 ?1445 01/10/22 ?1653 01/11/22 ?0258  ?WBC 6.9  --  4.2  ?NEUTROABS 5.0  --   --    ?HGB 12.0* 14.6 12.3*  ?HCT 40.3 43.0 38.8*  ?MCV 91.6  --  87.4  ?PLT 196  --  197  ? ?Basic Metabolic Panel: ?Recent Labs  ?Lab 01/10/22 ?1445 01/10/22 ?1653 01/11/22 ?0258  ?NA 133* 133* 134*  ?K 3.9 4.1 4.4  ?CL 99  --  101  ?CO2 23  --  23  ?GLUCOSE 95  --  157*  ?BUN 11  --  18  ?CREATININE 0.70  --  0.93  ?CALCIUM 9.5  --  9.2  ? ?GFR: ?Estimated Creatinine Clearance: 51.2 mL/min (by C-G formula based on SCr of 0.93 mg/dL). ?Liver Function Tests: ?No results for input(s): AST, ALT, ALKPHOS, BILITOT, PROT, ALBUMIN in the last 168 hours. ?No results for input(s): LIPASE, AMYLASE in the last 168 hours. ?No results for input(s): AMMONIA in the last 168 hours. ?Coagulation Profile: ?No results for input(s): INR, PROTIME in the last 168 hours. ?Cardiac Enzymes: ?No results for input(s): CKTOTAL, CKMB, CKMBINDEX, TROPONINI in the last 168 hours. ?BNP (last 3 results) ?No results for input(s): PROBNP in the last 8760 hours. ?HbA1C: ?Recent Labs  ?  01/11/22 ?0258  ?HGBA1C 6.1*  ? ?CBG: ?Recent Labs  ?Lab 01/11/22 ?0809 01/11/22 ?1215 01/11/22 ?1555 01/11/22 ?2056 01/12/22 ?  [place] ?Lipid Profile: ?No results for input(s): CHOL, HDL, LDLCALC, TRIG, CHOLHDL, LDLDIRECT in the last 72 hours. ?Thyroid Function Tests: ?No results for input(s): TSH, T4TOTAL, FREET4, T3FREE, THYROIDAB in the last 72 hours. ?Anemia Panel: ?No results for input(s): VITAMINB12, FOLATE, FERRITIN, TIBC, IRON, RETICCTPCT in the last 72 hours. ?Sepsis Labs: ?No results for input(s): PROCALCITON, LATICACIDVEN in the last 168 hours. ? ?Recent Results (from the past 240 hour(s))  ?Resp Panel by RT-PCR (Flu A&B, Covid) Nasopharyngeal Swab     Status: None  ? Collection Time: 01/10/22  4:09 PM  ? Specimen: Nasopharyngeal Swab; Nasopharyngeal(NP) swabs in vial transport medium  ?Result Value Ref Range Status  ? SARS Coronavirus 2 by RT PCR NEGATIVE NEGATIVE Final  ?  Comment: (NOTE) ?SARS-CoV-2 target nucleic acids are  NOT DETECTED. ? ?The SARS-CoV-2 RNA is generally detectable in upper respiratory ?specimens during the acute phase of infection. The lowest ?concentration of SARS-CoV-2 viral copies this assay can detect is ?138 copies/mL. A

## 2022-01-12 NOTE — Progress Notes (Signed)
Mobility Specialist Progress Note: ? ? 01/12/22 1430  ?Mobility  ?Activity Ambulated with assistance in hallway  ?Level of Assistance Standby assist, set-up cues, supervision of patient - no hands on  ?Assistive Device None  ?Distance Ambulated (ft) 400 ft  ?Activity Response Tolerated well  ?$Mobility charge 1 Mobility  ? ?Pt agreeable to second mobility session today. Required no physical assist. SpO2 90% on 6LO2. Back in room with all needs met.  ? ?Nelta Numbers ?Acute Rehab ?Secure Chat or ?Office Phone: [phone] ? ?

## 2022-01-12 NOTE — TOC Initial Note (Signed)
Transition of Care (TOC) - Initial/Assessment Note  ? ? ?Patient Details  ?Name: Jose Welch ?MRN: [id number] ?Date of Birth: 03-Mar-1956 ? ?Transition of Care (TOC) CM/SW Contact:    ?Zenon Mayo, RN ?Phone Number: ?01/12/2022, 1:51 PM ? ?Clinical Narrative:                 ?NCM spoke with patient at bedside, he lives alone, he states he will need a cane, and home oxygen and pulse oxyimetry.  He is ok with [employer] supplying this for him.  NCM made referral to [facility] for this DME.  Mobility will walk patient again to get the sats .  Then will need oxygen order.  Patient states he drove himself to the hospital and plans to drive himself home today. TOC will continue to follow for dc needs.  ? ?Expected Discharge Plan: Home/Self Care ?Barriers to Discharge: Continued Medical Work up ? ? ?Patient Goals and CMS Choice ?Patient states their goals for this hospitalization and ongoing recovery are:: return home ?  ?Choice offered to / list presented to : NA ? ?Expected Discharge Plan and Services ?Expected Discharge Plan: Home/Self Care ?  ?Discharge Planning Services: CM Consult ?Post Acute Care Choice: Durable Medical Equipment ?Living arrangements for the past 2 months: [place] ?                ?DME Arranged: Oxygen, Cane, Pulse oximeter ?DME Agency: [employer] ?Date DME Agency Contacted: 01/12/22 ?Time DME Agency Contacted: 0258 ?Representative spoke with at DME Agency: Guy Sandifer ?HH Arranged: NA ?  ?  ?  ?  ? ?Prior Living Arrangements/Services ?Living arrangements for the past 2 months: [place] ?Lives with:: Self ?Patient language and need for interpreter reviewed:: Yes ?Do you feel safe going back to the place where you live?: Yes      ?Need for Family Participation in Patient Care: No (Comment) ?Care giver support system in place?: No (comment) ?  ?Criminal Activity/Legal Involvement Pertinent to Current Situation/Hospitalization: No - Comment as needed ? ?Activities of Daily  Living ?  ?  ? ?Permission Sought/Granted ?  ?  ?   ?   ?   ?   ? ?Emotional Assessment ?Appearance:: Appears stated age ?Attitude/Demeanor/Rapport: Engaged ?Affect (typically observed): Appropriate ?Orientation: : Oriented to Self, Oriented to Place, Oriented to  Time, Oriented to Situation ?Alcohol / Substance Use: Not Applicable ?Psych Involvement: No (comment) ? ?Admission diagnosis:  COPD exacerbation ([place]) [J44.1] ?COPD with acute exacerbation ([place]) [J44.1] ?Patient Active Problem List  ? Diagnosis Date Noted  ? COPD with acute exacerbation ([place]) 01/10/2022  ? Type 2 diabetes mellitus ([place]) 01/10/2022  ? Hyperlipidemia associated with type 2 diabetes mellitus ([place]) 01/10/2022  ? Anxiety   ? Elevated troponin   ? Tobacco use   ? Carotid stenosis, left 08/30/2017  ? ?PCP:  Wenda Low, MD ?Pharmacy:   ?[place] [place] - [address] ?[address] ?[place] [address] ?Phone: [phone] Fax: [phone] ? ?[place], [place] ?[place] ?[place] [address] ?Phone: [phone] Fax: [phone] ? ? ? ? ?Social Determinants of Health (SDOH) Interventions ?  ? ?Readmission Risk Interventions ?   ? View : No data to display.  ?  ?  ?  ? ? ? ?

## 2022-01-12 NOTE — Progress Notes (Signed)
Mobility Specialist Progress Note: ? ? 01/12/22 1015  ?Mobility  ?Activity Ambulated with assistance in hallway  ?Level of Assistance Standby assist, set-up cues, supervision of patient - no hands on  ?Assistive Device None  ?Distance Ambulated (ft) 400 ft  ?Activity Response Tolerated fair  ?$Mobility charge 1 Mobility  ? ?Pt agreeable to mobility session. Required no physical assistance. SpO2 85-90% on 6LO2. Pt with no significant SOB. Pt back in bed with all needs met.  ? ?Nelta Numbers ?Acute Rehab ?Secure Chat or ?Office Phone: [phone] ? ?

## 2022-01-12 NOTE — Progress Notes (Signed)
SATURATION QUALIFICATIONS: (This note is used to comply with regulatory documentation for home oxygen) ? ?Patient Saturations on Room Air at Rest = 87% ? ?Patient Saturations on Room Air while Ambulating = N/A% ? ?Patient Saturations on 6 Liters of oxygen while Ambulating = 90% ? ?Please briefly explain why patient needs home oxygen: ? ?Jose Welch ?Acute Rehab ?Secure Chat or ?Office Phone: [phone] ? ?

## 2022-01-12 NOTE — Progress Notes (Signed)
Initial Nutrition Assessment ? ?DOCUMENTATION CODES:  ? ?Severe malnutrition in context of chronic illness, Underweight ? ?INTERVENTION:  ?Provide Ensure Enlive po TID, each supplement provides 350 kcal and 20 grams of protein. ? ?Encourage adequate PO intake.  ? ?NUTRITION DIAGNOSIS:  ? ?Severe Malnutrition related to chronic illness (COPD) as evidenced by severe fat depletion, severe muscle depletion, percent weight loss. ? ?GOAL:  ? ?Patient will meet greater than or equal to 90% of their needs ? ?MONITOR:  ? ?PO intake, Supplement acceptance, Labs, Weight trends, Skin, I & O's ? ?REASON FOR ASSESSMENT:  ? ?Consult ?Assessment of nutrition requirement/status ? ?ASSESSMENT:  ? ?66 year old male with past medical history significant for COPD, type 2 diabetes mellitus, hyperlipidemia, RBBB, CAS s/p left CEA, anxiety who presented for shortness of breath. Pt with COPD exacerbation. ? ?Meal completion has been 80-100%. Pt reports having a good appetite currently. Pt reports no eating well prior to admission with usual consumption of mostly 1-2 meals/day. Per weight records, pt with a 11% weight loss over the past 5 months, significant for time frame. RD to order nutritional supplements to aid in caloric and protein needs to aid in caloric and protein needs.  ? ?NUTRITION - FOCUSED PHYSICAL EXAM: ? ?Flowsheet Row Most Recent Value  ?Orbital Region Severe depletion  ?Upper Arm Region Severe depletion  ?Thoracic and Lumbar Region Severe depletion  ?Buccal Region Severe depletion  ?Temple Region Severe depletion  ?Clavicle Bone Region Severe depletion  ?Clavicle and Acromion Bone Region Severe depletion  ?Scapular Bone Region Severe depletion  ?Dorsal Hand Severe depletion  ?Patellar Region Severe depletion  ?Anterior Thigh Region Severe depletion  ?Posterior Calf Region Severe depletion  ?Edema (RD Assessment) None  ?Hair Reviewed  ?Eyes Reviewed  ?Mouth Reviewed  [dentures in]  ?Skin Reviewed  ?Nails Reviewed  ? ?   ? ?Labs and medications reviewed.  ? ?Diet Order:   ?Diet Order   ? ?       ?  Diet Carb Modified Fluid consistency: Thin; Room service appropriate? Yes  Diet effective now       ?  ? ?  ?  ? ?  ? ? ?EDUCATION NEEDS:  ? ?Not appropriate for education at this time ? ?Skin:  Skin Assessment: Reviewed RN Assessment ? ?Last BM:  5/10 ? ?Height:  ? ?Ht Readings from Last 1 Encounters:  ?01/11/22 '5\' 7"'$  (1.702 m)  ? ? ?Weight:  ? ?Wt Readings from Last 1 Encounters:  ?01/12/22 47 kg  ? ?BMI:  Body mass index is 16.24 kg/m?. ? ?Estimated Nutritional Needs:  ? ?Kcal:  1800-2000 ? ?Protein:  90-100 grams ? ?Fluid:  >/= 1.8 L/day ? ?Corrin Parker, MS, RD, LDN ?RD pager number/after hours weekend pager number on Amion. ? ?

## 2022-01-13 DIAGNOSIS — J441 Chronic obstructive pulmonary disease with (acute) exacerbation: Secondary | ICD-10-CM | POA: Diagnosis not present

## 2022-01-13 LAB — GLUCOSE, CAPILLARY
Glucose-Capillary: 119 mg/dL — ABNORMAL HIGH (ref 70–99)
Glucose-Capillary: 136 mg/dL — ABNORMAL HIGH (ref 70–99)

## 2022-01-13 MED ORDER — PREDNISONE 10 MG PO TABS: ORAL_TABLET | ORAL | 0 refills | Status: AC

## 2022-01-13 MED ORDER — AZITHROMYCIN 500 MG PO TABS: 500.0000 mg | ORAL_TABLET | Freq: Every day | ORAL | 0 refills | Status: AC

## 2022-01-13 MED ORDER — FLUTICASONE-SALMETEROL 500-50 MCG/ACT IN AEPB: 1.0000 | INHALATION_SPRAY | Freq: Two times a day (BID) | RESPIRATORY_TRACT | 2 refills | Status: DC

## 2022-01-13 MED ORDER — SPIRIVA HANDIHALER 18 MCG IN CAPS: 18.0000 ug | ORAL_CAPSULE | Freq: Every day | RESPIRATORY_TRACT | 2 refills | Status: DC

## 2022-01-13 NOTE — Progress Notes (Signed)
Patient discharged: Home  ? ?Via: Wheelchair  ? ?Discharge paperwork given: to patient with cane and oxygen that was delivered ? ?Reviewed with teach back ? ?IV and telemetry disconnected ? ?Belongings given to patient ? ?  ?

## 2022-01-13 NOTE — Progress Notes (Signed)
CM contacted, pt in need of oxygen, pulse ox and cane before d/c.  ?

## 2022-01-13 NOTE — Discharge Summary (Addendum)
?Physician Discharge Summary  ?[name] IRS:[id number] DOB: [birth year] DOA: 01/10/2022 ? ?PCP: Wenda Low, MD ? ?Admit date: 01/10/2022 ?Discharge date: 01/13/2022 ? ?Admitted From: Home ?Disposition: Home ? ?Recommendations for Outpatient Follow-up:  ?Follow up with PCP in 1-2 weeks ?Ambulatory referral placed to pulmonology for further management of his poorly controlled COPD ?Continue azithromycin to complete antibiotic course ?Continue prednisone taper ?Started on Advair and Spiriva ?Continue to encourage tobacco cessation ? ?Home Health: No ?Equipment/Devices: Oxygen, 6 L with ambulation, pulse oximeter, cane ? ?Discharge Condition: Stable ?CODE STATUS: Full code ?Diet recommendation: Heart healthy diet ? ?History of present illness: ? ?Jose Welch is a 66 year old male with past medical history significant for COPD, type 2 diabetes mellitus, hyperlipidemia, RBBB, CAS s/p left CEA, anxiety who presented to [facility] ED on 5/10 for shortness of breath.  Patient was sent from his PCP office due to dyspnea, low SPO2.  Reports progressive shortness of breath over the last 4 days and feels this has been triggered by pollen.  Also reports new cough productive of clear sputum.  Has been utilizing his albuterol rescue inhaler without significant improvement.  As his PCP office he was noted to have SPO2 in the low 80s while on room air and was ultimately directed to the ED for further evaluation.  Denies any chest pain, no fever/chills, no diaphoresis, no nausea/vomiting.  Reports ongoing tobacco use, at least 1 pack/day. ?  ?In the ED, temperature 98.3 ?F, HR 94, RR 22, BP 138/97, SPO2 89% on room air.  WBC 6.9, hemoglobin 12.0, platelets 196.  Sodium 133, potassium 3.9, chloride 99, CO2 23, glucose 95.  BNP 26.8.  High sensitive troponin 68>83>37.  COVID-19 PCR negative.  Influenza A/B PCR negative.  Chest x-ray with chronic obstructive pulmonary disease and emphysema, no evidence of acute cardiopulmonary disease  process.  Patient was given IV Solu-Medrol 125 mg x 1, DuoNeb's x2.  Hospitalist service consulted for further evaluation and management of acute hypoxic respiratory failure secondary to COPD exacerbation. ? ?Hospital course: ? ?Acute hypoxic respiratory failure, POA ?COPD exacerbation ?Patient presenting to ED by discretion of PCP for hypoxia with increasing shortness of breath for the last 4 days with associated clear sputum production.  Patient is afebrile without leukocytosis.  Chest x-ray with no acute cardiopulmonary disease process.  Patient continues to endorse tobacco abuse smoking 1 pack/day.  Patient was started on scheduled Brovana, Pulmicort and DuoNebs and IV Solu-Medrol.  Albuterol was given as needed.  Patient was started on azithromycin for antibiotic coverage.  Patient's symptoms greatly improved during hospitalization but still requires oxygen with exertion, up to 6 L/min.  Patient adamant about discharging home.  Discussed need for compliance with tobacco cessation and home medication regimen.  We will continue prednisone taper on discharge, starting Advair and Spiriva.  Patient has a nebulizer unit at home with albuterol to use as needed.  Ambulatory referral placed to pulmonology for further management. ?  ?Elevated troponin ?Minimally elevated upon admission, 68 followed by 83 followed by 37.  Denies chest pain.  EKG without concerning findings.  Suspect type II demand ischemia in the setting of acute COPD exacerbation. ?  ?Type 2 diabetes mellitus ?Hemoglobin A1c 6.1, well controlled.  On metformin outpatient. ?  ?Hyperlipidemia ?Atorvastatin 10 mg p.o. daily ?  ?Carotid artery stenosis, left ?Previously s/p left CEA.  Continue statin, no aspirin listed as outpatient medication. ?  ?Tobacco use disorder ?Continues to endorse smoking 1 pack/day.  Counseled on need for  complete cessation. ?  ?Severe protein calorie malnutrition ?Body mass index is 16.24 kg/m?Marland Kitchen ?Nutrition Status: ?Nutrition  Problem: Severe Malnutrition ?Etiology: chronic illness (COPD) ?Signs/Symptoms: severe fat depletion, severe muscle depletion, percent weight loss ?Percent weight loss: 11 % (5 mos) ?Interventions: Ensure Enlive (each supplement provides 350kcal and 20 grams of protein) ?Evaluated by dietitian while inpatient, continue to encourage increased oral intake. ? ? ?Discharge Diagnoses:  ?Principal Problem: ?  COPD with acute exacerbation ([place]) ?Active Problems: ?  Elevated troponin ?  Type 2 diabetes mellitus ([place]) ?  Carotid stenosis, left ?  Hyperlipidemia associated with type 2 diabetes mellitus ([place]) ?  Tobacco use ?  Protein-calorie malnutrition, severe ? ? ? ?Discharge Instructions ? ?Discharge Instructions   ? ? Ambulatory referral to Pulmonology   Complete by: As directed ?  ? Reason for referral: Asthma/COPD  ? Call MD for:  difficulty breathing, headache or visual disturbances   Complete by: As directed ?  ? Call MD for:  extreme fatigue   Complete by: As directed ?  ? Call MD for:  persistant dizziness or light-headedness   Complete by: As directed ?  ? Call MD for:  persistant nausea and vomiting   Complete by: As directed ?  ? Call MD for:  severe uncontrolled pain   Complete by: As directed ?  ? Call MD for:  temperature >100.4   Complete by: As directed ?  ? Diet - low sodium heart healthy   Complete by: As directed ?  ? Increase activity slowly   Complete by: As directed ?  ? ?  ? ?Allergies as of 01/13/2022   ?No Known Allergies ?  ? ?  ?Medication List  ?  ? ?STOP taking these medications   ? ?meloxicam 15 MG tablet ?Commonly known as: MOBIC ?  ? ?  ? ?TAKE these medications   ? ?albuterol 108 (90 Base) MCG/ACT inhaler ?Commonly known as: VENTOLIN HFA ?Inhale 1 puff into the lungs every 6 (six) hours as needed for shortness of breath. ?  ?atorvastatin 10 MG tablet ?Commonly known as: LIPITOR ?Take 10 mg by mouth daily. ?  ?azithromycin 500 MG tablet ?Commonly known as: Zithromax ?Take 1 tablet (500 mg  total) by mouth daily for 3 days. Take 1 tablet daily for 3 days. ?Start taking on: Jan 14, 2022 ?  ?diclofenac Sodium 1 % Gel ?Commonly known as: Voltaren ?Apply 4 g topically 4 (four) times daily. ?What changed:  ?when to take this ?reasons to take this ?  ?famotidine 20 MG tablet ?Commonly known as: PEPCID ?Take 20 mg by mouth daily as needed for heartburn. ?  ?fluticasone 50 MCG/ACT nasal spray ?Commonly known as: FLONASE ?Place 1 spray into both nostrils daily as needed for allergies. ?  ?fluticasone-salmeterol 500-50 MCG/ACT Aepb ?Commonly known as: ADVAIR ?Inhale 1 puff into the lungs in the morning and at bedtime. ?  ?metFORMIN 500 MG tablet ?Commonly known as: GLUCOPHAGE ?Take 500 mg by mouth 2 (two) times daily with a meal. ?  ?ONE TOUCH ULTRA TEST test strip ?Generic drug: glucose blood ?  ?OneTouch Delica Lancets Fine Misc ?  ?predniSONE 10 MG tablet ?Commonly known as: DELTASONE ?Take 5 tablets (50 mg total) by mouth daily for 2 days, THEN 4 tablets (40 mg total) daily for 2 days, THEN 3 tablets (30 mg total) daily for 2 days, THEN 2 tablets (20 mg total) daily for 2 days, THEN 1 tablet (10 mg total) daily for 2 days. ?Start  taking on: Jan 13, 2022 ?  ?Spiriva HandiHaler 18 MCG inhalation capsule ?Generic drug: tiotropium ?Place 1 capsule (18 mcg total) into inhaler and inhale daily. ?  ? ?  ? ?  ?  ? ? ?  ?Durable Medical Equipment  ?(From admission, onward)  ?  ? ? ?  ? ?  Start     Ordered  ? 01/12/22 1512  For home use only DME oxygen  Once       ?Comments: POC eval  ?Question Answer Comment  ?Length of Need Lifetime   ?Mode or (Route) Nasal cannula   ?Liters per Minute 6   ?Frequency Continuous (stationary and portable oxygen unit needed)   ?Oxygen conserving device Yes   ?Oxygen delivery system Gas   ?  ? 01/12/22 1512  ? 01/12/22 1349  For home use only DME Other see comment  Once       ?Comments: PULSE OXIMETRY  ?Question:  Length of Need  Answer:  Lifetime  ? 01/12/22 1349  ? 01/12/22 1348  For  home use only DME Cane  Once       ? 01/12/22 1347  ? ?  ?  ? ?  ? ? Follow-up Information   ? ? Wenda Low, MD Follow up.   ?Specialty: Internal Medicine ?Contact information: ?[address] ?[place]

## 2022-01-22 DIAGNOSIS — J441 Chronic obstructive pulmonary disease with (acute) exacerbation: Secondary | ICD-10-CM | POA: Diagnosis not present

## 2022-01-22 DIAGNOSIS — J9601 Acute respiratory failure with hypoxia: Secondary | ICD-10-CM | POA: Diagnosis not present

## 2022-01-22 DIAGNOSIS — E1169 Type 2 diabetes mellitus with other specified complication: Secondary | ICD-10-CM | POA: Diagnosis not present

## 2022-01-22 DIAGNOSIS — F17210 Nicotine dependence, cigarettes, uncomplicated: Secondary | ICD-10-CM | POA: Diagnosis not present

## 2022-01-22 DIAGNOSIS — E46 Unspecified protein-calorie malnutrition: Secondary | ICD-10-CM | POA: Diagnosis not present

## 2022-01-22 DIAGNOSIS — R030 Elevated blood-pressure reading, without diagnosis of hypertension: Secondary | ICD-10-CM | POA: Diagnosis not present

## 2022-01-24 ENCOUNTER — Encounter: Payer: Self-pay | Admitting: Pulmonary Disease

## 2022-01-24 ENCOUNTER — Ambulatory Visit: Payer: Medicare HMO | Admitting: Pulmonary Disease

## 2022-01-24 VITALS — BP 128/64 | HR 75 | Ht 67.0 in | Wt 108.2 lb

## 2022-01-24 DIAGNOSIS — J441 Chronic obstructive pulmonary disease with (acute) exacerbation: Secondary | ICD-10-CM | POA: Diagnosis not present

## 2022-01-24 DIAGNOSIS — R0602 Shortness of breath: Secondary | ICD-10-CM | POA: Diagnosis not present

## 2022-01-24 DIAGNOSIS — R918 Other nonspecific abnormal finding of lung field: Secondary | ICD-10-CM | POA: Diagnosis not present

## 2022-01-24 NOTE — Progress Notes (Addendum)
Jose Welch    [id number]    Apr 01, 1956  Primary Care Physician:Husain, Denton Ar, MD  Referring Physician: [place] Eric J, DO [address] [place],  [place] [address]  Chief complaint: Consult for COPD  HPI: 66 year old with COPD, type II diabetes mellitus, hyperlipidemia, admitted in May 2023 with acute hypoxic respiratory failure secondary to COPD exacerbation. Treated with bronchodilators, steroids azithromycin. Discharged on advair on Spiriva. This was later changed to breztri by primary care  Continues to have chronic dyspnea on exertion, cough with congestion. 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 No further pillars or comforters Smoking history: 75 pack year smoker. Continues to smoke half pack per day Travel history: no significant travel Relevant family history: no family history of lung disease  Outpatient Encounter Medications as of 01/24/2022  Medication Sig   albuterol (VENTOLIN HFA) 108 (90 Base) MCG/ACT inhaler Inhale 1 puff into the lungs every 6 (six) hours as needed for shortness of breath.   atorvastatin (LIPITOR) 10 MG tablet Take 10 mg by mouth daily.   diclofenac Sodium (VOLTAREN) 1 % GEL Apply 4 g topically 4 (four) times daily. (Patient taking differently: Apply 4 g topically daily as needed (for pain).)   famotidine (PEPCID) 20 MG tablet Take 20 mg by mouth daily as needed for heartburn.   fluticasone (FLONASE) 50 MCG/ACT nasal spray Place 1 spray into both nostrils daily as needed for allergies.   metFORMIN (GLUCOPHAGE) 500 MG tablet Take 500 mg by mouth 2 (two) times daily with a meal.   ONE TOUCH ULTRA TEST test strip    ONETOUCH DELICA LANCETS FINE MISC    fluticasone-salmeterol (ADVAIR) 500-50 MCG/ACT AEPB Inhale 1 puff into the lungs in the morning and at bedtime. (Patient not taking: Reported on 01/24/2022)   tiotropium  (SPIRIVA HANDIHALER) 18 MCG inhalation capsule Place 1 capsule (18 mcg total) into inhaler and inhale daily. (Patient not taking: Reported on 01/24/2022)   No facility-administered encounter medications on file as of 01/24/2022.    Allergies as of 01/24/2022   (No Known Allergies)    Past Medical History:  Diagnosis Date   Anxiety    Arthritis    Right wrist   Carotid artery occlusion    Carotid stenosis, left    COPD (chronic obstructive pulmonary disease) (HCC)    Diabetes mellitus without complication (HCC)    Type II   GERD (gastroesophageal reflux disease)    History of hiatal hernia     Past Surgical History:  Procedure Laterality Date   COLONOSCOPY W/ POLYPECTOMY     COLONOSCOPY WITH PROPOFOL N/A 07/18/2015   Procedure: COLONOSCOPY WITH PROPOFOL;  Surgeon: Garlan Fair, MD;  Location: [facility] ENDOSCOPY;  Service: Endoscopy;  Laterality: N/A;   ENDARTERECTOMY Left 08/30/2017   Procedure: ENDARTERECTOMY CAROTID LEFT;  Surgeon: Serafina Mitchell, MD;  Location: [facility] OR;  Service: Vascular;  Laterality: Left;   TONSILLECTOMY     WISDOM TOOTH EXTRACTION      No family history on file.  Social History   Socioeconomic History   Marital status: Single    Spouse name: Not on file   Number of children: Not on file   Years of education: Not on file   Highest education level: Not on file  Occupational History   Not on file  Tobacco Use   Smoking  status: Every Day    Packs/day: 0.50    Types: Cigarettes   Smokeless tobacco: Never  Vaping Use   Vaping Use: Never used  Substance and Sexual Activity   Alcohol use: Yes    Alcohol/week: 0.0 standard drinks    Comment: occasional   Drug use: No   Sexual activity: Not on file  Other Topics Concern   Not on file  Social History Narrative   Not on file   Social Determinants of Health   Financial Resource Strain: Not on file  Food Insecurity: Not on file  Transportation Needs: Not on file  Physical Activity: Not on file   Stress: Not on file  Social Connections: Not on file  Intimate Partner Violence: Not on file    Review of systems: Review of Systems  Constitutional: Negative for fever and chills.  HENT: Negative.   Eyes: Negative for blurred vision.  Respiratory: as per HPI  Cardiovascular: Negative for chest pain and palpitations.  Gastrointestinal: Negative for vomiting, diarrhea, blood per rectum. Genitourinary: Negative for dysuria, urgency, frequency and hematuria.  Musculoskeletal: Negative for myalgias, back pain and joint pain.  Skin: Negative for itching and rash.  Neurological: Negative for dizziness, tremors, focal weakness, seizures and loss of consciousness.  Endo/Heme/Allergies: Negative for environmental allergies.  Psychiatric/Behavioral: Negative for depression, suicidal ideas and hallucinations.  All other systems reviewed and are negative.  Physical Exam: Blood pressure 128/64, pulse 75, height '5\' 7"'$  (1.702 m), weight 108 lb 3.2 oz (49.1 kg), SpO2 94 %. Gen:      No acute distress HEENT:  EOMI, sclera anicteric Neck:     No masses; no thyromegaly Lungs:    Clear to auscultation bilaterally; normal respiratory effort CV:         Regular rate and rhythm; no murmurs Abd:      + bowel sounds; soft, non-tender; no palpable masses, no distension Ext:    No edema; adequate peripheral perfusion Skin:      Warm and dry; no rash Neuro: alert and oriented x 3 Psych: normal mood and affect  Data Reviewed: Imaging: CT chest 01/07/2006-two small lung nodules in the right lower lobe CT chest 11/14/21 - two lung nodules in the right lower lobe. The largest measures 7.5 mm. I have reviewed the images personally  PFTs:  Labs:  Assessment:  Assessment for COPD He had a recent admission for exacerbation. Currently stable on breztri Continue current inhaler therapy Check CBC differential, IgE and A1AT levels and phenotype for baseline evaluation Schedule PFTs.  Lung nodules He has  two right lower lobe lung nodules on CT from March 2023 though this was not noted in the read. Interestingly CT scan from 2007 also notes to lung nodules in the right lower lobe which are much smaller. I cannot tell if this is the same nodules as they appear to be in slightly different places.  Will get a short-term follow-up for three month assessment with a repeat scan  Tobacco use We discussed smoking cessation briefly today. Will readdress in detail at return visit.  Plan/Recommendations: CBC, IGE, A1AT test PFTs CT chest  Marshell Garfinkel MD [facility] 01/24/2022, 2:55 PM  CC: [place] Donnamarie Poag, DO

## 2022-01-24 NOTE — Patient Instructions (Addendum)
Will check CBC differential, IGE and alpha-1 anti-Levels and Phenotype Schedule Pulmonary Function Tests we get a CT chest without contrast for follow-up of lung nodules next two weeks follow-up in clinic in 1 to 2 months

## 2022-01-25 LAB — CBC WITH DIFFERENTIAL/PLATELET
Basophils Absolute: 0.1 10*3/uL (ref 0.0–0.1)
Basophils Relative: 0.5 % (ref 0.0–3.0)
Eosinophils Absolute: 0.1 10*3/uL (ref 0.0–0.7)
Eosinophils Relative: 0.5 % (ref 0.0–5.0)
HCT: 37.9 % — ABNORMAL LOW (ref 39.0–52.0)
Hemoglobin: 12.1 g/dL — ABNORMAL LOW (ref 13.0–17.0)
Lymphocytes Relative: 18.2 % (ref 12.0–46.0)
Lymphs Abs: 2 10*3/uL (ref 0.7–4.0)
MCHC: 31.8 g/dL (ref 30.0–36.0)
MCV: 86.3 fl (ref 78.0–100.0)
Monocytes Absolute: 0.6 10*3/uL (ref 0.1–1.0)
Monocytes Relative: 5.3 % (ref 3.0–12.0)
Neutro Abs: 8.3 10*3/uL — ABNORMAL HIGH (ref 1.4–7.7)
Neutrophils Relative %: 75.5 % (ref 43.0–77.0)
Platelets: 265 10*3/uL (ref 150.0–400.0)
RBC: 4.39 Mil/uL (ref 4.22–5.81)
RDW: 15.5 % (ref 11.5–15.5)
WBC: 11 10*3/uL — ABNORMAL HIGH (ref 4.0–10.5)

## 2022-01-25 LAB — IGE: IgE (Immunoglobulin E), Serum: 897 kU/L — ABNORMAL HIGH (ref <=114)

## 2022-02-05 LAB — ALPHA-1 ANTITRYPSIN PHENOTYPE: A-1 Antitrypsin, Ser: 152 mg/dL (ref 83–199)

## 2022-02-13 DIAGNOSIS — E46 Unspecified protein-calorie malnutrition: Secondary | ICD-10-CM | POA: Diagnosis not present

## 2022-02-13 DIAGNOSIS — F17210 Nicotine dependence, cigarettes, uncomplicated: Secondary | ICD-10-CM | POA: Diagnosis not present

## 2022-02-13 DIAGNOSIS — E1169 Type 2 diabetes mellitus with other specified complication: Secondary | ICD-10-CM | POA: Diagnosis not present

## 2022-02-13 DIAGNOSIS — J449 Chronic obstructive pulmonary disease, unspecified: Secondary | ICD-10-CM | POA: Diagnosis not present

## 2022-02-13 DIAGNOSIS — E782 Mixed hyperlipidemia: Secondary | ICD-10-CM | POA: Diagnosis not present

## 2022-02-13 DIAGNOSIS — R911 Solitary pulmonary nodule: Secondary | ICD-10-CM | POA: Diagnosis not present

## 2022-02-19 ENCOUNTER — Ambulatory Visit
Admission: RE | Admit: 2022-02-19 | Discharge: 2022-02-19 | Disposition: A | Discharge status: Home / Self Care | Payer: Medicare HMO | Source: Ambulatory Visit | Attending: Pulmonary Disease | Admitting: Pulmonary Disease

## 2022-02-19 DIAGNOSIS — R911 Solitary pulmonary nodule: Secondary | ICD-10-CM | POA: Diagnosis not present

## 2022-02-19 DIAGNOSIS — R918 Other nonspecific abnormal finding of lung field: Secondary | ICD-10-CM

## 2022-02-22 ENCOUNTER — Telehealth: Payer: Self-pay | Admitting: Pulmonary Disease

## 2022-02-22 NOTE — Telephone Encounter (Signed)
Received form from patient.  Will complete and update once Dr. Vaughan Browner has signed.

## 2022-02-27 NOTE — Telephone Encounter (Signed)
Signed insurance form received from Dr. Isaiah Serge. Patient called and made aware form was completed.  Patient stated he would come by [facility] office and pick up. Insurance form placed in sealed envelope at front desk for patient pick up. Nothing further at this time.

## 2022-03-14 ENCOUNTER — Telehealth: Payer: Self-pay | Admitting: Pulmonary Disease

## 2022-03-14 DIAGNOSIS — R918 Other nonspecific abnormal finding of lung field: Secondary | ICD-10-CM

## 2022-03-14 NOTE — Telephone Encounter (Signed)
Called patient and went over CT scan results. Patient verbalized agreement for CT scan in 6 months. Orders placed. Nothing further needed

## 2022-04-03 ENCOUNTER — Telehealth: Payer: Self-pay | Admitting: Pulmonary Disease

## 2022-04-03 ENCOUNTER — Ambulatory Visit: Payer: Medicare HMO | Admitting: Pulmonary Disease

## 2022-04-03 ENCOUNTER — Encounter: Payer: Self-pay | Admitting: Pulmonary Disease

## 2022-04-03 ENCOUNTER — Ambulatory Visit (INDEPENDENT_AMBULATORY_CARE_PROVIDER_SITE_OTHER): Payer: Medicare HMO | Admitting: Pulmonary Disease

## 2022-04-03 VITALS — BP 116/68 | HR 85 | Ht 67.0 in | Wt 108.0 lb

## 2022-04-03 DIAGNOSIS — F17210 Nicotine dependence, cigarettes, uncomplicated: Secondary | ICD-10-CM | POA: Diagnosis not present

## 2022-04-03 DIAGNOSIS — R0602 Shortness of breath: Secondary | ICD-10-CM | POA: Diagnosis not present

## 2022-04-03 DIAGNOSIS — J449 Chronic obstructive pulmonary disease, unspecified: Secondary | ICD-10-CM | POA: Diagnosis not present

## 2022-04-03 DIAGNOSIS — R918 Other nonspecific abnormal finding of lung field: Secondary | ICD-10-CM

## 2022-04-03 LAB — PULMONARY FUNCTION TEST
DL/VA % pred: 32 %
DL/VA: 1.36 ml/min/mmHg/L
DLCO cor % pred: 29 %
DLCO cor: 7 ml/min/mmHg
DLCO unc % pred: 29 %
DLCO unc: 7 ml/min/mmHg
FEF 25-75 Post: 0.77 L/sec
FEF 25-75 Pre: 0.57 L/sec
FEF2575-%Change-Post: 34 %
FEF2575-%Pred-Post: 32 %
FEF2575-%Pred-Pre: 24 %
FEV1-%Change-Post: 14 %
FEV1-%Pred-Post: 51 %
FEV1-%Pred-Pre: 44 %
FEV1-Post: 1.53 L
FEV1-Pre: 1.34 L
FEV1FVC-%Change-Post: 4 %
FEV1FVC-%Pred-Pre: 64 %
FEV6-%Change-Post: 9 %
FEV6-%Pred-Post: 79 %
FEV6-%Pred-Pre: 72 %
FEV6-Post: 3.04 L
FEV6-Pre: 2.77 L
FEV6FVC-%Change-Post: 0 %
FEV6FVC-%Pred-Post: 105 %
FEV6FVC-%Pred-Pre: 105 %
FVC-%Change-Post: 9 %
FVC-%Pred-Post: 75 %
FVC-%Pred-Pre: 68 %
FVC-Post: 3.05 L
FVC-Pre: 2.79 L
Post FEV1/FVC ratio: 50 %
Post FEV6/FVC ratio: 100 %
Pre FEV1/FVC ratio: 48 %
Pre FEV6/FVC Ratio: 99 %
RV % pred: 175 %
RV: 3.9 L
TLC % pred: 113 %
TLC: 7.25 L

## 2022-04-03 MED ORDER — NICOTINE 21 MG/24HR TD PT24: 21.0000 mg | MEDICATED_PATCH | Freq: Every day | TRANSDERMAL | 1 refills | Status: DC

## 2022-04-03 MED ORDER — VARENICLINE TARTRATE 0.5 MG X 11 & 1 MG X 42 PO TBPK: ORAL_TABLET | ORAL | 0 refills | Status: DC

## 2022-04-03 NOTE — Patient Instructions (Signed)
We prescribed nicotine patches and Chantix to help with smoking cessation Continue breztri inhaler CT chest without contrast in January for follow-up on lung nodules Follow-up in clinic in 5 months after CT scan

## 2022-04-03 NOTE — Progress Notes (Signed)
Full PFT performed today. °

## 2022-04-03 NOTE — Telephone Encounter (Signed)
Patient called and he stated he wanted tanks to be picked up and that he wants a POC. Does patient wear oxygen. I don't see anything in his chart about oxygen?  And have you walked him to see if he qualifies for a pOC?  Please advise Jose Welch

## 2022-04-03 NOTE — Patient Instructions (Signed)
Full PFT performed today. °

## 2022-04-03 NOTE — Progress Notes (Signed)
Jose Welch    [id number]    [birth year]  Primary Care Physician:Husain, Denton Ar, MD  Referring Physician: Wenda Low, MD [place] [employer] [address] [place],  [place] [id number]  Chief complaint: Follow up for COPD  HPI: 66 y.o. with COPD, type II diabetes mellitus, hyperlipidemia, admitted in May 2023 with acute hypoxic respiratory failure secondary to COPD exacerbation. Treated with bronchodilators, steroids azithromycin. Discharged on advair on Spiriva. This was later changed to breztri by primary care  Continues to have chronic dyspnea on exertion, cough with congestion. 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 No further pillars or comforters Smoking history: 75 pack year smoker. Continues to smoke half pack per day Travel history: no significant travel Relevant family history: no family history of lung disease  Interim history: Continues on breztri inhaler which is helping some with his breathing He is still smoking.  Outpatient Encounter Medications as of 04/03/2022  Medication Sig   atorvastatin (LIPITOR) 10 MG tablet Take 10 mg by mouth daily.   BREZTRI AEROSPHERE 160-9-4.8 MCG/ACT AERO Inhale 2 puffs into the lungs in the morning and at bedtime.   diclofenac Sodium (VOLTAREN) 1 % GEL Apply 4 g topically 4 (four) times daily. (Patient taking differently: Apply 4 g topically daily as needed (for pain).)   famotidine (PEPCID) 20 MG tablet Take 20 mg by mouth daily as needed for heartburn.   fluticasone (FLONASE) 50 MCG/ACT nasal spray Place 1 spray into both nostrils daily as needed for allergies.   metFORMIN (GLUCOPHAGE) 500 MG tablet Take 500 mg by mouth 2 (two) times daily with a meal.   ONE TOUCH ULTRA TEST test strip    ONETOUCH DELICA LANCETS FINE MISC    albuterol (VENTOLIN HFA) 108 (90 Base) MCG/ACT inhaler Inhale 1 puff into the lungs every 6  (six) hours as needed for shortness of breath. (Patient not taking: Reported on 04/03/2022)   [DISCONTINUED] fluticasone-salmeterol (ADVAIR) 500-50 MCG/ACT AEPB Inhale 1 puff into the lungs in the morning and at bedtime. (Patient not taking: Reported on 01/24/2022)   [DISCONTINUED] tiotropium (SPIRIVA HANDIHALER) 18 MCG inhalation capsule Place 1 capsule (18 mcg total) into inhaler and inhale daily. (Patient not taking: Reported on 01/24/2022)   No facility-administered encounter medications on file as of 04/03/2022.    Physical Exam: Blood pressure 116/68, pulse 85, height '5\' 7"'$  (1.702 m), weight 108 lb (49 kg), SpO2 95 %. Gen:      No acute distress HEENT:  EOMI, sclera anicteric Neck:     No masses; no thyromegaly Lungs:    Clear to auscultation bilaterally; normal respiratory effort CV:         Regular rate and rhythm; no murmurs Abd:      + bowel sounds; soft, non-tender; no palpable masses, no distension Ext:    No edema; adequate peripheral perfusion Skin:      Warm and dry; no rash Neuro: alert and oriented x 3 Psych: normal mood and affect   Data Reviewed: Imaging: CT chest 01/07/2006-two small lung nodules in the right lower lobe CT chest 11/14/21 - two lung nodules in the right lower lobe. The largest measures 7.5 mm.  CT chest 02/19/2022-new 3 mm right lower lobe lung nodule. I have reviewed the images personally  PFTs:  Labs: CBC 01/24/2022-WBC 11, eos 0.5%, absolute eosinophil count 55 IgE 01/24/2022-897 Alpha-1  antitrypsin 01/24/2022-152, PI MM  Assessment:  Severe COPD He had a recent admission for exacerbation. Currently stable on breztri Continue current inhaler therapy PFTs reviewed with severe obstruction  Lung nodules 60-monthfollow up CT ordered  Tobacco use He continues to smoke.  Discussed smoking cessation in detail.  Order nicotine patches and Chantix Reassess at return visit.  Time spent counseling-5  Plan/Recommendations: Continue breztri Nicotine  patches and Chantix for smoking cessation  PMarshell GarfinkelMD [facility] 04/03/2022, 10:24 AM  CC: HWenda Low MD

## 2022-04-04 NOTE — Telephone Encounter (Signed)
Patient was seen in office 04/03/22.  Patient did not request POC, qualifying walk, or state he was on O2.  Patient sats were 95% on room air. There is no past O2 orders or DME referral in chart. ATC patient to see what DME he used and explain POC qualifications.  LM to call back.

## 2022-04-05 NOTE — Telephone Encounter (Signed)
ATC patient.  LM to call back. 

## 2022-04-12 NOTE — Telephone Encounter (Signed)
Message was left for patient to return call with O2/DME information.  Patient has not returned call at this time.  Will close encounter.

## 2022-04-15 DIAGNOSIS — J449 Chronic obstructive pulmonary disease, unspecified: Secondary | ICD-10-CM | POA: Diagnosis not present

## 2022-04-17 DIAGNOSIS — J449 Chronic obstructive pulmonary disease, unspecified: Secondary | ICD-10-CM | POA: Diagnosis not present

## 2022-04-17 DIAGNOSIS — E46 Unspecified protein-calorie malnutrition: Secondary | ICD-10-CM | POA: Diagnosis not present

## 2022-04-17 DIAGNOSIS — I700 Atherosclerosis of aorta: Secondary | ICD-10-CM | POA: Diagnosis not present

## 2022-04-17 DIAGNOSIS — F341 Dysthymic disorder: Secondary | ICD-10-CM | POA: Diagnosis not present

## 2022-04-17 DIAGNOSIS — R911 Solitary pulmonary nodule: Secondary | ICD-10-CM | POA: Diagnosis not present

## 2022-04-17 DIAGNOSIS — F17210 Nicotine dependence, cigarettes, uncomplicated: Secondary | ICD-10-CM | POA: Diagnosis not present

## 2022-04-17 DIAGNOSIS — E1169 Type 2 diabetes mellitus with other specified complication: Secondary | ICD-10-CM | POA: Diagnosis not present

## 2022-04-26 DIAGNOSIS — B370 Candidal stomatitis: Secondary | ICD-10-CM | POA: Diagnosis not present

## 2022-05-10 DIAGNOSIS — J449 Chronic obstructive pulmonary disease, unspecified: Secondary | ICD-10-CM | POA: Diagnosis not present

## 2022-05-16 DIAGNOSIS — J449 Chronic obstructive pulmonary disease, unspecified: Secondary | ICD-10-CM | POA: Diagnosis not present

## 2022-06-09 DIAGNOSIS — J449 Chronic obstructive pulmonary disease, unspecified: Secondary | ICD-10-CM | POA: Diagnosis not present

## 2022-06-28 DIAGNOSIS — J449 Chronic obstructive pulmonary disease, unspecified: Secondary | ICD-10-CM | POA: Diagnosis not present

## 2022-06-28 DIAGNOSIS — R911 Solitary pulmonary nodule: Secondary | ICD-10-CM | POA: Diagnosis not present

## 2022-06-28 DIAGNOSIS — E46 Unspecified protein-calorie malnutrition: Secondary | ICD-10-CM | POA: Diagnosis not present

## 2022-06-28 DIAGNOSIS — I779 Disorder of arteries and arterioles, unspecified: Secondary | ICD-10-CM | POA: Diagnosis not present

## 2022-06-28 DIAGNOSIS — E1169 Type 2 diabetes mellitus with other specified complication: Secondary | ICD-10-CM | POA: Diagnosis not present

## 2022-06-28 DIAGNOSIS — Z23 Encounter for immunization: Secondary | ICD-10-CM | POA: Diagnosis not present

## 2022-06-28 DIAGNOSIS — I700 Atherosclerosis of aorta: Secondary | ICD-10-CM | POA: Diagnosis not present

## 2022-06-28 DIAGNOSIS — J310 Chronic rhinitis: Secondary | ICD-10-CM | POA: Diagnosis not present

## 2022-06-28 DIAGNOSIS — Z125 Encounter for screening for malignant neoplasm of prostate: Secondary | ICD-10-CM | POA: Diagnosis not present

## 2022-06-28 DIAGNOSIS — E782 Mixed hyperlipidemia: Secondary | ICD-10-CM | POA: Diagnosis not present

## 2022-06-28 DIAGNOSIS — Z0000 Encounter for general adult medical examination without abnormal findings: Secondary | ICD-10-CM | POA: Diagnosis not present

## 2022-06-28 DIAGNOSIS — K219 Gastro-esophageal reflux disease without esophagitis: Secondary | ICD-10-CM | POA: Diagnosis not present

## 2022-06-28 DIAGNOSIS — Z1331 Encounter for screening for depression: Secondary | ICD-10-CM | POA: Diagnosis not present

## 2022-08-16 DIAGNOSIS — J449 Chronic obstructive pulmonary disease, unspecified: Secondary | ICD-10-CM | POA: Diagnosis not present

## 2022-08-16 DIAGNOSIS — K219 Gastro-esophageal reflux disease without esophagitis: Secondary | ICD-10-CM | POA: Diagnosis not present

## 2022-08-16 DIAGNOSIS — E782 Mixed hyperlipidemia: Secondary | ICD-10-CM | POA: Diagnosis not present

## 2022-08-16 DIAGNOSIS — E1169 Type 2 diabetes mellitus with other specified complication: Secondary | ICD-10-CM | POA: Diagnosis not present

## 2022-09-11 ENCOUNTER — Other Ambulatory Visit: Payer: Medicare HMO

## 2022-09-15 DIAGNOSIS — J9601 Acute respiratory failure with hypoxia: Secondary | ICD-10-CM | POA: Diagnosis not present

## 2022-09-24 ENCOUNTER — Telehealth: Payer: Self-pay | Admitting: Pulmonary Disease

## 2022-09-24 NOTE — Telephone Encounter (Signed)
PT has changed insurance and they want documents saying it is OK for him to continue his to have O2. Pls call PT to advise. His # is [phone]   New Ins is [employer] [phone]  Member # [id number]

## 2022-09-26 NOTE — Telephone Encounter (Signed)
Patient is returning phone call. Patient phone number is [phone].

## 2022-09-26 NOTE — Telephone Encounter (Signed)
Called patient but he did not answer. Left message for patient to call back.  °

## 2022-09-26 NOTE — Telephone Encounter (Signed)
Patient is returning phone call. Patient phone number is [phone].

## 2022-09-26 NOTE — Telephone Encounter (Signed)
Called and left voicemail for patient to call office back for more information with oxygen and new insurance.

## 2022-09-27 NOTE — Telephone Encounter (Signed)
Patient is returning phone call. Patient phone number is [phone].

## 2022-09-28 ENCOUNTER — Telehealth: Payer: Self-pay | Admitting: Pulmonary Disease

## 2022-09-28 NOTE — Telephone Encounter (Signed)
PT has a CT scan on the 27th and wants to see what is covered by ins. Pls call to advise @ [phone]   Updated his Ins. Medicare Part A & B and [employer]

## 2022-09-28 NOTE — Telephone Encounter (Signed)
I called the pt and made him aware we will get authorized with insurance but we don't know how they will pay.  He can call his insurance and give them the CPT code is [phone] and they will let him know how they will cover it.  He states ok he will call them.  Nothing further needed.

## 2022-09-28 NOTE — Telephone Encounter (Signed)
Spoke pt who wants to get a POC vs the portable tanks he was supplied with by his DME company. Pt is unsure of which DME he uses but said he spoke with them yesterday. The DME is requiring an order from [place] to get POC and pick up the tanks. RN explained O2 qualification process and pt stated understanding. Pt states he was originally given O2 when he left the hospital.  Pt scheduled with Geraldo Pitter on 10/09/22 for in office evaluation.   Routing to [employer] as [employer]

## 2022-09-28 NOTE — Telephone Encounter (Signed)
Pt calling again regarding O2. Please try again. TY.

## 2022-10-08 NOTE — Progress Notes (Unsigned)
$'@Patient'm$  ID: Jose Welch, male    DOB: 04/12/1956, 67 y.o.   MRN: [id number]  No chief complaint on file.   Referring provider: Wenda Low, MD  HPI: 67 year old male, current every day smoker. PMH significant for COPD, carotid stenosis, hyperlipidemia, type 2 diabetes.   10/09/2022 Patient presents today to qualify for POC. Maintained on Breztri PFTs in August 2023 with severe obstruction + pos BD (FEV1 51% predicted)   No Known Allergies  Immunization History  Administered Date(s) Administered   Influenza Split 06/15/2014, 05/17/2015, 07/04/2017, 06/10/2020, 06/02/2021   Influenza, Seasonal, Injecte, Preservative Fre 05/22/2016   Influenza,inj,Quad PF,6+ Mos 05/22/2018, 05/27/2019   PFIZER(Purple Top)SARS-COV-2 Vaccination 11/26/2019, 06/21/2020   Pfizer Covid-19 Vaccine Bivalent Booster 76yr & up 09/24/2021   Pneumococcal Polysaccharide-23 03/03/2014, 08/31/2017   Td 01/19/2006   Tdap 04/16/2016   Zoster, Live 04/05/2015    Past Medical History:  Diagnosis Date   Anxiety    Arthritis    Right wrist   Carotid artery occlusion    Carotid stenosis, left    COPD (chronic obstructive pulmonary disease) (HCC)    Diabetes mellitus without complication (HCC)    Type II   GERD (gastroesophageal reflux disease)    History of hiatal hernia     Tobacco History: Social History   Tobacco Use  Smoking Status Every Day   Packs/day: 0.50   Types: Cigarettes  Smokeless Tobacco Never   Ready to quit: Not Answered Counseling given: Not Answered   Outpatient Medications Prior to Visit  Medication Sig Dispense Refill   albuterol (VENTOLIN HFA) 108 (90 Base) MCG/ACT inhaler Inhale 1 puff into the lungs every 6 (six) hours as needed for shortness of breath. (Patient not taking: Reported on 04/03/2022)     atorvastatin (LIPITOR) 10 MG tablet Take 10 mg by mouth daily.     BREZTRI AEROSPHERE 160-9-4.8 MCG/ACT AERO Inhale 2 puffs into the lungs in the morning and at  bedtime.     diclofenac Sodium (VOLTAREN) 1 % GEL Apply 4 g topically 4 (four) times daily. (Patient taking differently: Apply 4 g topically daily as needed (for pain).) 100 g 0   famotidine (PEPCID) 20 MG tablet Take 20 mg by mouth daily as needed for heartburn.     fluticasone (FLONASE) 50 MCG/ACT nasal spray Place 1 spray into both nostrils daily as needed for allergies.     metFORMIN (GLUCOPHAGE) 500 MG tablet Take 500 mg by mouth 2 (two) times daily with a meal.     nicotine (NICODERM CQ - DOSED IN MG/24 HOURS) 21 mg/24hr patch Place 1 patch (21 mg total) onto the skin daily. 28 patch 1   ONE TOUCH ULTRA TEST test strip      ONETOUCH DELICA LANCETS FINE MISC      varenicline (CHANTIX PAK) 0.5 MG X 11 & 1 MG X 42 tablet Take one 0.5 mg tablet by mouth once daily for 3 days, then increase to one 0.5 mg tablet twice daily for 4 days, then increase to one 1 mg tablet twice daily. 53 tablet 0   No facility-administered medications prior to visit.      Review of Systems  Review of Systems   Physical Exam  There were no vitals taken for this visit. Physical Exam   Lab Results:  CBC    Component Value Date/Time   WBC 11.0 (H) 01/24/2022 1524   RBC 4.39 01/24/2022 1524   HGB 12.1 (L) 01/24/2022 1524   HCT  37.9 (L) 01/24/2022 1524   PLT 265.0 01/24/2022 1524   MCV 86.3 01/24/2022 1524   MCH 27.7 01/11/2022 0258   MCHC 31.8 01/24/2022 1524   RDW 15.5 01/24/2022 1524   LYMPHSABS 2.0 01/24/2022 1524   MONOABS 0.6 01/24/2022 1524   EOSABS 0.1 01/24/2022 1524   BASOSABS 0.1 01/24/2022 1524    BMET    Component Value Date/Time   NA 134 (L) 01/11/2022 0258   K 4.4 01/11/2022 0258   CL 101 01/11/2022 0258   CO2 23 01/11/2022 0258   GLUCOSE 157 (H) 01/11/2022 0258   BUN 18 01/11/2022 0258   CREATININE 0.93 01/11/2022 0258   CALCIUM 9.2 01/11/2022 0258   GFRNONAA >60 01/11/2022 0258   GFRAA >60 08/31/2017 0316    BNP    Component Value Date/Time   BNP 26.8 01/10/2022  1654    ProBNP No results found for: "PROBNP"  Imaging: No results found.   Assessment & Plan:   No problem-specific Assessment & Plan notes found for this encounter.     Martyn Ehrich, NP 10/08/2022

## 2022-10-09 ENCOUNTER — Encounter: Payer: Self-pay | Admitting: Primary Care

## 2022-10-09 ENCOUNTER — Ambulatory Visit (INDEPENDENT_AMBULATORY_CARE_PROVIDER_SITE_OTHER): Payer: No Typology Code available for payment source | Admitting: Primary Care

## 2022-10-09 VITALS — BP 138/84 | HR 92 | Ht 67.0 in | Wt 117.0 lb

## 2022-10-09 DIAGNOSIS — J9611 Chronic respiratory failure with hypoxia: Secondary | ICD-10-CM

## 2022-10-09 DIAGNOSIS — J449 Chronic obstructive pulmonary disease, unspecified: Secondary | ICD-10-CM | POA: Diagnosis not present

## 2022-10-09 DIAGNOSIS — Z720 Tobacco use: Secondary | ICD-10-CM | POA: Diagnosis not present

## 2022-10-09 NOTE — Assessment & Plan Note (Addendum)
-   Started on oxygen in May 2023 after hospitalization for AECOPD - Qualified for POC today>>SpO2 dropped to 86% after walk 225f on room air, needs to use 3L with exertion to maintain level >88-90%

## 2022-10-09 NOTE — Assessment & Plan Note (Addendum)
-   Stable; Not acutely exacerbated. Keeps chronic cough  - Continue Breztei two puffs morning and evening - Use Albuterol inhaler 2 puffs every 4-6 hours as needed for breakthrough shortness of breath/wheezing - Take mucinex over the counter twice daily to help loosen congestion

## 2022-10-09 NOTE — Patient Instructions (Addendum)
Recommendations: Contiue Breztei two puffs morning and evening Use Albuterol inhaler 2 puffs every 4-6 hours as needed for breakthrough shortness of breath/wheezing Take mucinex over the counter twice daily to help loosen congestion Taper amount you are smoking and pick quit date  Keep CT chest scan on Feb 27th   Orders: Ambulatory walk test for POC qualification   Follow-up 3 months with Dr. Meredeth Ide can receive free nicotine replacement therapy ( patches, gum or mints) by calling 1-800-QUIT NOW. Please call so we can get you on the path to becoming  a non-smoker. I know it is hard, but you can do this!  Other options for assistance in smoking cessation ( As covered by your insurance benefits)  Hypnosis for smoking cessation  [employer]. [phone]  Acupuncture for smoking cessation  [employer] [phone]

## 2022-10-09 NOTE — Addendum Note (Signed)
Addended by: June Leap on: 10/09/2022 05:13 PM   Modules accepted: Orders

## 2022-10-09 NOTE — Assessment & Plan Note (Signed)
-   Smoking cessation strongly encourage - CT chest scheduled for 10/30/22 to monitor lung nodule

## 2022-10-10 NOTE — Addendum Note (Signed)
Addended by: June Leap on: 10/10/2022 08:19 AM   Modules accepted: Orders

## 2022-10-16 DIAGNOSIS — J449 Chronic obstructive pulmonary disease, unspecified: Secondary | ICD-10-CM | POA: Diagnosis not present

## 2022-10-16 DIAGNOSIS — J9601 Acute respiratory failure with hypoxia: Secondary | ICD-10-CM | POA: Diagnosis not present

## 2022-10-17 ENCOUNTER — Encounter (HOSPITAL_COMMUNITY): Payer: Self-pay

## 2022-10-17 ENCOUNTER — Ambulatory Visit (HOSPITAL_COMMUNITY)
Admission: EM | Admit: 2022-10-17 | Discharge: 2022-10-17 | Disposition: A | Discharge status: Home / Self Care | Payer: No Typology Code available for payment source | Attending: Physician Assistant | Admitting: Physician Assistant

## 2022-10-17 ENCOUNTER — Ambulatory Visit (HOSPITAL_COMMUNITY): Payer: No Typology Code available for payment source

## 2022-10-17 DIAGNOSIS — W19XXXA Unspecified fall, initial encounter: Secondary | ICD-10-CM

## 2022-10-17 DIAGNOSIS — M25561 Pain in right knee: Secondary | ICD-10-CM

## 2022-10-17 DIAGNOSIS — S8001XA Contusion of right knee, initial encounter: Secondary | ICD-10-CM | POA: Diagnosis not present

## 2022-10-17 MED ORDER — MELOXICAM 7.5 MG PO TABS: 7.5000 mg | ORAL_TABLET | Freq: Every day | ORAL | 0 refills | Status: AC

## 2022-10-17 NOTE — ED Notes (Signed)
Given verbal order from provider Verna Czech, PA to place Patient on 3 L/min via nasal Canula. Completed and Patient O2 stat at 91%.

## 2022-10-17 NOTE — Discharge Instructions (Signed)
Your x-ray was normal.  I suspect that you have a contusion.  Keep your leg elevated and use the brace for comfort and support.  I also recommend ice when you are sitting at home with your leg elevated.  Avoid strenuous activity including lots of walking.  Use Mobic to help with pain.  Do not take NSAIDs with this medication including aspirin, ibuprofen/Advil, naproxen/Aleve.  You can use acetaminophen/Tylenol for breakthrough pain.  Call and schedule appointment with sports medicine.  If anything worsens and you have instability, increasing pain, redness or swelling in your leg you should be seen immediately.

## 2022-10-17 NOTE — ED Triage Notes (Signed)
Patient states he missed a step last night and fell. Patient state he twisted his right leg and felt a pop in his right knee. Pain is worse with weight bearing.  Patient states he took a [place] powder approx 10 minutes ago for pain.  Patient's room air sats 83%. Patient states he has COPD and that is normal for him.

## 2022-10-17 NOTE — ED Provider Notes (Signed)
[place]    CSN: [id number] Arrival date & time: 10/17/22  1637      History   Chief Complaint Chief Complaint  Patient presents with   Fall   Knee Pain    HPI Jose Welch is a 67 y.o. male.   Patient presents today with a 24-hour history of right knee pain.  Reports that yesterday when he was going down the stairs he missed the last step causing him to fall and twisting his right knee.  He has had pain on the lateral portion of his right knee since that time.  Pain is rated 6 on a 0-10 pain scale, described as aching, worse with attempted ambulation or bearing weight, no alleviating factors identified.  He denies previous injury or surgery involving his knee.  He did not hit his head and denies any loss of consciousness, headache, dizziness, nausea, vomiting.  He does not take blood thinning medication on a regular basis.  He has tried [place] powders without improvement of symptoms.  Denies any popping, clicking, instability.  Does report feeling a pop when the injury initially occurred.  Patient was noted to have oxygen saturation of 83% on room air.  He does have a history of COPD and has oxygen concentrator in his car but is not currently using any oxygen.  He was placed on oxygen here at 2 L/min and oxygen improved to between 91 and 93%.  He denies any shortness of breath, worsening cough, increased sputum production.    Past Medical History:  Diagnosis Date   Anxiety    Arthritis    Right wrist   Carotid artery occlusion    Carotid stenosis, left    COPD (chronic obstructive pulmonary disease) (HCC)    Diabetes mellitus without complication (HCC)    Type II   GERD (gastroesophageal reflux disease)    History of hiatal hernia     Patient Active Problem List   Diagnosis Date Noted   Chronic respiratory failure with hypoxia ([place]) 10/09/2022   Protein-calorie malnutrition, severe 01/12/2022   COPD (chronic obstructive pulmonary disease) ([place]) 01/10/2022    Type 2 diabetes mellitus ([place]) 01/10/2022   Hyperlipidemia associated with type 2 diabetes mellitus ([place]) 01/10/2022   Anxiety    Elevated troponin    Tobacco use    Carotid stenosis, left 08/30/2017    Past Surgical History:  Procedure Laterality Date   COLONOSCOPY W/ POLYPECTOMY     COLONOSCOPY WITH PROPOFOL N/A 07/18/2015   Procedure: COLONOSCOPY WITH PROPOFOL;  Surgeon: Garlan Fair, MD;  Location: [facility];  Service: Endoscopy;  Laterality: N/A;   ENDARTERECTOMY Left 08/30/2017   Procedure: ENDARTERECTOMY CAROTID LEFT;  Surgeon: Serafina Mitchell, MD;  [facility];  Service: Vascular;  Laterality: Left;   TONSILLECTOMY     WISDOM TOOTH EXTRACTION         Home Medications    Prior to Admission medications   Medication Sig Start Date End Date Taking? Authorizing Provider  meloxicam (MOBIC) 7.5 MG tablet Take 1 tablet (7.5 mg total) by mouth daily. 10/17/22  Yes Azazel Franze K, PA-C  albuterol (VENTOLIN HFA) 108 (90 Base) MCG/ACT inhaler Inhale 1 puff into the lungs every 6 (six) hours as needed for shortness of breath. 04/29/20   [provider]  ALPRAZolam (XANAX) 0.25 MG tablet TAKE 1 TABLET BY MOUTH TWICE DAILY AS NEEDED FOR 30 DAYS    [provider]  atorvastatin (LIPITOR) 10 MG tablet Take 10 mg  by mouth daily. 07/24/17   [provider]  BREZTRI AEROSPHERE 160-9-4.8 MCG/ACT AERO Inhale 2 puffs into the lungs in the morning and at bedtime. 02/06/22   [provider]  diclofenac Sodium (VOLTAREN) 1 % GEL Apply 4 g topically 4 (four) times daily. Patient taking differently: Apply 4 g topically daily as needed (for pain). 09/25/21   Hazel Sams, PA-C  famotidine (PEPCID) 20 MG tablet Take 20 mg by mouth daily as needed for heartburn.    [provider]  fluticasone (FLONASE) 50 MCG/ACT nasal spray Place 1 spray into both nostrils daily as needed for allergies. 04/25/20   [provider]  metFORMIN (GLUCOPHAGE)  500 MG tablet Take 500 mg by mouth 2 (two) times daily with a meal. 07/29/17   [provider]  nicotine (NICODERM CQ - DOSED IN MG/24 HOURS) 21 mg/24hr patch Place 1 patch (21 mg total) onto the skin daily. Patient not taking: Reported on 10/09/2022 04/03/22   Marshell Garfinkel, MD  ONE TOUCH ULTRA TEST test strip  07/30/17   [provider]  Jonetta Speak LANCETS FINE [place]  08/01/17   [provider]  varenicline (CHANTIX PAK) 0.5 MG X 11 & 1 MG X 42 tablet Take one 0.5 mg tablet by mouth once daily for 3 days, then increase to one 0.5 mg tablet twice daily for 4 days, then increase to one 1 mg tablet twice daily. Patient not taking: Reported on 10/09/2022 04/03/22   Marshell Garfinkel, MD    Family History History reviewed. No pertinent family history.  Social History Social History   Tobacco Use   Smoking status: Every Day    Packs/day: 1.00    Types: Cigarettes   Smokeless tobacco: Never  Vaping Use   Vaping Use: Never used  Substance Use Topics   Alcohol use: Yes    Alcohol/week: 0.0 standard drinks of alcohol    Comment: occasional   Drug use: No     Allergies   Patient has no known allergies.   Review of Systems Review of Systems  Constitutional:  Positive for activity change. Negative for appetite change, fatigue and fever.  Respiratory:  Negative for cough and shortness of breath.   Cardiovascular:  Negative for chest pain.  Gastrointestinal:  Negative for abdominal pain, diarrhea, nausea and vomiting.  Musculoskeletal:  Positive for arthralgias, gait problem and joint swelling. Negative for myalgias.  Neurological:  Negative for weakness and numbness.     Physical Exam Triage Vital Signs ED Triage Vitals [10/17/22 1727]  Enc Vitals Group     BP (!) 155/91     Pulse Rate 100     Resp 16     Temp 98 F (36.7 C)     Temp Source Oral     SpO2 (!) 83 %     Weight      Height      Head Circumference      Peak Flow      Pain Score 6      Pain Loc      Pain Edu?      Excl. in [place]?    No data found.  Updated Vital Signs BP (!) 155/91 (BP Location: Right Arm)   Pulse 100   Temp 98 F (36.7 C) (Oral)   Resp 16   SpO2 91%   Visual Acuity Right Eye Distance:   Left Eye Distance:   Bilateral Distance:    Right Eye Near:   Left  Eye Near:    Bilateral Near:     Physical Exam Vitals reviewed.  Constitutional:      General: He is awake.     Appearance: Normal appearance. He is well-developed. He is not ill-appearing.     Interventions: Nasal cannula in place.     Comments: Very pleasant male appears stated age in no acute distress sitting comfortably in exam room  HENT:     Head: Normocephalic and atraumatic.     Mouth/Throat:     Pharynx: Uvula midline. No oropharyngeal exudate or posterior oropharyngeal erythema.  Cardiovascular:     Rate and Rhythm: Normal rate and regular rhythm.     Heart sounds: Normal heart sounds, S1 normal and S2 normal. No murmur heard. Pulmonary:     Effort: Pulmonary effort is normal.     Breath sounds: Normal breath sounds. No stridor. No wheezing, rhonchi or rales.     Comments: Clear to auscultation bilaterally Abdominal:     Palpations: Abdomen is soft.     Tenderness: There is no abdominal tenderness.  Musculoskeletal:     Right knee: Bony tenderness present. No swelling or deformity. Decreased range of motion. Tenderness present over the lateral joint line. No medial joint line tenderness. No LCL laxity, MCL laxity, ACL laxity or PCL laxity.     Comments: Right knee: Tenderness to palpation over lateral joint line.  No deformity noted.  No ligamentous laxity on exam.  Antalgic gait.    Neurological:     Mental Status: He is alert.  Psychiatric:        Behavior: Behavior is cooperative.      UC Treatments / Results  Labs (all labs ordered are listed, but only abnormal results are displayed) Labs Reviewed - No data to display  EKG   Radiology DG Knee Complete 4  Views Right  Result Date: 10/17/2022 CLINICAL DATA:  Right knee pain and swelling after fall yesterday. EXAM: RIGHT KNEE - COMPLETE 4+ VIEW COMPARISON:  None Available. FINDINGS: No evidence of fracture, dislocation, or joint effusion. No evidence of arthropathy or other focal bone abnormality. Soft tissues are unremarkable. IMPRESSION: Negative. Electronically Signed   By: Marijo Conception M.D.   On: 10/17/2022 18:11    Procedures Procedures (including critical care time)  Medications Ordered in UC Medications - No data to display  Initial Impression / Assessment and Plan / UC Course  I have reviewed the triage vital signs and the nursing notes.  Pertinent labs & imaging results that were available during my care of the patient were reviewed by me and considered in my medical decision making (see chart for details).     Patient is well-appearing, afebrile, nontoxic, nontachycardic.  X-ray was obtained given mechanism of injury which showed no acute osseous abnormality.  Suspect contusion as etiology of symptoms.  He was placed in a brace for comfort and support.  Recommended RICE protocol at home.  He was given meloxicam 7.5 mg daily to help with pain and inflammation with instruction to take additional NSAIDs with this medication.  He can use acetaminophen/Tylenol for breakthrough pain.  Recommended that he follow-up with sports medicine was given contact information for local provider.  Discussed that if he has any worsening or changing symptoms including instability, increasing pain, swelling or redness of the leg he should be seen immediately.  Strict return precautions given.  Work excuse note provided.  Patient was encouraged to continue using supplemental oxygen as prescribed by his PCP.  Patient has pulse oximeter and well monitor his oxygen saturation at home and home and adjust oxygen as needed.  Final Clinical Impressions(s) / UC Diagnoses   Final diagnoses:  Contusion of right  knee, initial encounter  Acute pain of right knee     Discharge Instructions      Your x-ray was normal.  I suspect that you have a contusion.  Keep your leg elevated and use the brace for comfort and support.  I also recommend ice when you are sitting at home with your leg elevated.  Avoid strenuous activity including lots of walking.  Use Mobic to help with pain.  Do not take NSAIDs with this medication including aspirin, ibuprofen/Advil, naproxen/Aleve.  You can use acetaminophen/Tylenol for breakthrough pain.  Call and schedule appointment with sports medicine.  If anything worsens and you have instability, increasing pain, redness or swelling in your leg you should be seen immediately.     ED Prescriptions     Medication Sig Dispense Auth. Provider   meloxicam (MOBIC) 7.5 MG tablet Take 1 tablet (7.5 mg total) by mouth daily. 14 tablet Moriyah Byington, Derry Skill, PA-C      PDMP not reviewed this encounter.   Terrilee Croak, PA-C 10/17/22 1830

## 2022-10-30 ENCOUNTER — Ambulatory Visit
Admission: RE | Admit: 2022-10-30 | Discharge: 2022-10-30 | Disposition: A | Discharge status: Home / Self Care | Payer: No Typology Code available for payment source | Source: Ambulatory Visit | Attending: Pulmonary Disease

## 2022-10-30 DIAGNOSIS — R911 Solitary pulmonary nodule: Secondary | ICD-10-CM | POA: Diagnosis not present

## 2022-10-30 DIAGNOSIS — R918 Other nonspecific abnormal finding of lung field: Secondary | ICD-10-CM

## 2022-10-30 DIAGNOSIS — J439 Emphysema, unspecified: Secondary | ICD-10-CM | POA: Diagnosis not present

## 2022-10-30 DIAGNOSIS — I700 Atherosclerosis of aorta: Secondary | ICD-10-CM | POA: Diagnosis not present

## 2022-11-06 DIAGNOSIS — J441 Chronic obstructive pulmonary disease with (acute) exacerbation: Secondary | ICD-10-CM | POA: Diagnosis not present

## 2022-11-06 DIAGNOSIS — Z03818 Encounter for observation for suspected exposure to other biological agents ruled out: Secondary | ICD-10-CM | POA: Diagnosis not present

## 2022-11-06 DIAGNOSIS — J069 Acute upper respiratory infection, unspecified: Secondary | ICD-10-CM | POA: Diagnosis not present

## 2022-11-14 DIAGNOSIS — J449 Chronic obstructive pulmonary disease, unspecified: Secondary | ICD-10-CM | POA: Diagnosis not present

## 2022-11-14 DIAGNOSIS — J9601 Acute respiratory failure with hypoxia: Secondary | ICD-10-CM | POA: Diagnosis not present

## 2022-11-15 ENCOUNTER — Other Ambulatory Visit: Payer: Self-pay | Admitting: *Deleted

## 2022-11-15 DIAGNOSIS — Z720 Tobacco use: Secondary | ICD-10-CM

## 2022-11-27 DIAGNOSIS — E782 Mixed hyperlipidemia: Secondary | ICD-10-CM | POA: Diagnosis not present

## 2022-11-27 DIAGNOSIS — J449 Chronic obstructive pulmonary disease, unspecified: Secondary | ICD-10-CM | POA: Diagnosis not present

## 2022-11-27 DIAGNOSIS — E1169 Type 2 diabetes mellitus with other specified complication: Secondary | ICD-10-CM | POA: Diagnosis not present

## 2022-12-15 DIAGNOSIS — J9601 Acute respiratory failure with hypoxia: Secondary | ICD-10-CM | POA: Diagnosis not present

## 2022-12-15 DIAGNOSIS — J449 Chronic obstructive pulmonary disease, unspecified: Secondary | ICD-10-CM | POA: Diagnosis not present

## 2022-12-26 DIAGNOSIS — F419 Anxiety disorder, unspecified: Secondary | ICD-10-CM | POA: Diagnosis not present

## 2022-12-26 DIAGNOSIS — J9611 Chronic respiratory failure with hypoxia: Secondary | ICD-10-CM | POA: Diagnosis not present

## 2022-12-26 DIAGNOSIS — J449 Chronic obstructive pulmonary disease, unspecified: Secondary | ICD-10-CM | POA: Diagnosis not present

## 2022-12-26 DIAGNOSIS — E113291 Type 2 diabetes mellitus with mild nonproliferative diabetic retinopathy without macular edema, right eye: Secondary | ICD-10-CM | POA: Diagnosis not present

## 2022-12-26 DIAGNOSIS — E46 Unspecified protein-calorie malnutrition: Secondary | ICD-10-CM | POA: Diagnosis not present

## 2022-12-26 DIAGNOSIS — I779 Disorder of arteries and arterioles, unspecified: Secondary | ICD-10-CM | POA: Diagnosis not present

## 2022-12-26 DIAGNOSIS — E11319 Type 2 diabetes mellitus with unspecified diabetic retinopathy without macular edema: Secondary | ICD-10-CM | POA: Diagnosis not present

## 2022-12-26 DIAGNOSIS — F17210 Nicotine dependence, cigarettes, uncomplicated: Secondary | ICD-10-CM | POA: Diagnosis not present

## 2022-12-26 DIAGNOSIS — I700 Atherosclerosis of aorta: Secondary | ICD-10-CM | POA: Diagnosis not present

## 2023-01-02 DIAGNOSIS — Z23 Encounter for immunization: Secondary | ICD-10-CM | POA: Diagnosis not present

## 2023-01-14 DIAGNOSIS — J9601 Acute respiratory failure with hypoxia: Secondary | ICD-10-CM | POA: Diagnosis not present

## 2023-01-14 DIAGNOSIS — J449 Chronic obstructive pulmonary disease, unspecified: Secondary | ICD-10-CM | POA: Diagnosis not present

## 2023-02-14 DIAGNOSIS — J449 Chronic obstructive pulmonary disease, unspecified: Secondary | ICD-10-CM | POA: Diagnosis not present

## 2023-02-14 DIAGNOSIS — J9601 Acute respiratory failure with hypoxia: Secondary | ICD-10-CM | POA: Diagnosis not present

## 2023-03-16 DIAGNOSIS — J449 Chronic obstructive pulmonary disease, unspecified: Secondary | ICD-10-CM | POA: Diagnosis not present

## 2023-03-16 DIAGNOSIS — J9601 Acute respiratory failure with hypoxia: Secondary | ICD-10-CM | POA: Diagnosis not present

## 2023-04-16 DIAGNOSIS — J9601 Acute respiratory failure with hypoxia: Secondary | ICD-10-CM | POA: Diagnosis not present

## 2023-04-16 DIAGNOSIS — J449 Chronic obstructive pulmonary disease, unspecified: Secondary | ICD-10-CM | POA: Diagnosis not present

## 2023-04-24 DIAGNOSIS — Z86010 Personal history of colonic polyps: Secondary | ICD-10-CM | POA: Diagnosis not present

## 2023-04-24 DIAGNOSIS — K219 Gastro-esophageal reflux disease without esophagitis: Secondary | ICD-10-CM | POA: Diagnosis not present

## 2023-05-17 DIAGNOSIS — J9601 Acute respiratory failure with hypoxia: Secondary | ICD-10-CM | POA: Diagnosis not present

## 2023-05-17 DIAGNOSIS — J449 Chronic obstructive pulmonary disease, unspecified: Secondary | ICD-10-CM | POA: Diagnosis not present

## 2023-05-27 DIAGNOSIS — J441 Chronic obstructive pulmonary disease with (acute) exacerbation: Secondary | ICD-10-CM | POA: Diagnosis not present

## 2023-06-05 DIAGNOSIS — Z9981 Dependence on supplemental oxygen: Secondary | ICD-10-CM | POA: Diagnosis not present

## 2023-06-05 DIAGNOSIS — F411 Generalized anxiety disorder: Secondary | ICD-10-CM | POA: Diagnosis not present

## 2023-06-05 DIAGNOSIS — Z008 Encounter for other general examination: Secondary | ICD-10-CM | POA: Diagnosis not present

## 2023-06-05 DIAGNOSIS — Z79899 Other long term (current) drug therapy: Secondary | ICD-10-CM | POA: Diagnosis not present

## 2023-06-05 DIAGNOSIS — Z681 Body mass index (BMI) 19 or less, adult: Secondary | ICD-10-CM | POA: Diagnosis not present

## 2023-06-05 DIAGNOSIS — F17210 Nicotine dependence, cigarettes, uncomplicated: Secondary | ICD-10-CM | POA: Diagnosis not present

## 2023-06-05 DIAGNOSIS — J449 Chronic obstructive pulmonary disease, unspecified: Secondary | ICD-10-CM | POA: Diagnosis not present

## 2023-06-05 DIAGNOSIS — E46 Unspecified protein-calorie malnutrition: Secondary | ICD-10-CM | POA: Diagnosis not present

## 2023-06-05 DIAGNOSIS — R2681 Unsteadiness on feet: Secondary | ICD-10-CM | POA: Diagnosis not present

## 2023-06-16 DIAGNOSIS — J9601 Acute respiratory failure with hypoxia: Secondary | ICD-10-CM | POA: Diagnosis not present

## 2023-06-16 DIAGNOSIS — J449 Chronic obstructive pulmonary disease, unspecified: Secondary | ICD-10-CM | POA: Diagnosis not present

## 2023-06-17 DIAGNOSIS — J441 Chronic obstructive pulmonary disease with (acute) exacerbation: Secondary | ICD-10-CM | POA: Diagnosis not present

## 2023-06-25 DIAGNOSIS — R21 Rash and other nonspecific skin eruption: Secondary | ICD-10-CM | POA: Diagnosis not present

## 2023-07-02 DIAGNOSIS — Z1331 Encounter for screening for depression: Secondary | ICD-10-CM | POA: Diagnosis not present

## 2023-07-02 DIAGNOSIS — Z9981 Dependence on supplemental oxygen: Secondary | ICD-10-CM | POA: Diagnosis not present

## 2023-07-02 DIAGNOSIS — J449 Chronic obstructive pulmonary disease, unspecified: Secondary | ICD-10-CM | POA: Diagnosis not present

## 2023-07-02 DIAGNOSIS — J9611 Chronic respiratory failure with hypoxia: Secondary | ICD-10-CM | POA: Diagnosis not present

## 2023-07-02 DIAGNOSIS — Z0000 Encounter for general adult medical examination without abnormal findings: Secondary | ICD-10-CM | POA: Diagnosis not present

## 2023-07-02 DIAGNOSIS — F17210 Nicotine dependence, cigarettes, uncomplicated: Secondary | ICD-10-CM | POA: Diagnosis not present

## 2023-07-02 DIAGNOSIS — E11319 Type 2 diabetes mellitus with unspecified diabetic retinopathy without macular edema: Secondary | ICD-10-CM | POA: Diagnosis not present

## 2023-07-02 DIAGNOSIS — F419 Anxiety disorder, unspecified: Secondary | ICD-10-CM | POA: Diagnosis not present

## 2023-07-02 DIAGNOSIS — Z1211 Encounter for screening for malignant neoplasm of colon: Secondary | ICD-10-CM | POA: Diagnosis not present

## 2023-07-02 DIAGNOSIS — E113291 Type 2 diabetes mellitus with mild nonproliferative diabetic retinopathy without macular edema, right eye: Secondary | ICD-10-CM | POA: Diagnosis not present

## 2023-07-02 DIAGNOSIS — I700 Atherosclerosis of aorta: Secondary | ICD-10-CM | POA: Diagnosis not present

## 2023-07-02 DIAGNOSIS — E119 Type 2 diabetes mellitus without complications: Secondary | ICD-10-CM | POA: Diagnosis not present

## 2023-07-02 DIAGNOSIS — Z125 Encounter for screening for malignant neoplasm of prostate: Secondary | ICD-10-CM | POA: Diagnosis not present

## 2023-07-02 DIAGNOSIS — R911 Solitary pulmonary nodule: Secondary | ICD-10-CM | POA: Diagnosis not present

## 2023-07-03 DIAGNOSIS — I779 Disorder of arteries and arterioles, unspecified: Secondary | ICD-10-CM | POA: Diagnosis not present

## 2023-07-15 ENCOUNTER — Ambulatory Visit (INDEPENDENT_AMBULATORY_CARE_PROVIDER_SITE_OTHER): Payer: No Typology Code available for payment source | Admitting: Nurse Practitioner

## 2023-07-15 ENCOUNTER — Encounter: Payer: Self-pay | Admitting: Nurse Practitioner

## 2023-07-15 ENCOUNTER — Ambulatory Visit: Payer: No Typology Code available for payment source

## 2023-07-15 VITALS — BP 149/84 | HR 88 | Wt 120.4 lb

## 2023-07-15 DIAGNOSIS — R918 Other nonspecific abnormal finding of lung field: Secondary | ICD-10-CM | POA: Diagnosis not present

## 2023-07-15 DIAGNOSIS — J3089 Other allergic rhinitis: Secondary | ICD-10-CM | POA: Diagnosis not present

## 2023-07-15 DIAGNOSIS — J449 Chronic obstructive pulmonary disease, unspecified: Secondary | ICD-10-CM

## 2023-07-15 DIAGNOSIS — Q676 Pectus excavatum: Secondary | ICD-10-CM | POA: Diagnosis not present

## 2023-07-15 DIAGNOSIS — J309 Allergic rhinitis, unspecified: Secondary | ICD-10-CM | POA: Insufficient documentation

## 2023-07-15 DIAGNOSIS — Z720 Tobacco use: Secondary | ICD-10-CM | POA: Diagnosis not present

## 2023-07-15 DIAGNOSIS — R0609 Other forms of dyspnea: Secondary | ICD-10-CM

## 2023-07-15 DIAGNOSIS — J9611 Chronic respiratory failure with hypoxia: Secondary | ICD-10-CM | POA: Diagnosis not present

## 2023-07-15 DIAGNOSIS — I700 Atherosclerosis of aorta: Secondary | ICD-10-CM | POA: Diagnosis not present

## 2023-07-15 DIAGNOSIS — R600 Localized edema: Secondary | ICD-10-CM | POA: Diagnosis not present

## 2023-07-15 DIAGNOSIS — R0602 Shortness of breath: Secondary | ICD-10-CM | POA: Diagnosis not present

## 2023-07-15 LAB — BASIC METABOLIC PANEL
BUN: 17 mg/dL (ref 6–23)
CO2: 25 meq/L (ref 19–32)
Calcium: 9.6 mg/dL (ref 8.4–10.5)
Chloride: 100 meq/L (ref 96–112)
Creatinine, Ser: 0.79 mg/dL (ref 0.40–1.50)
GFR: 91.25 mL/min (ref >60.00)
Glucose, Bld: 90 mg/dL (ref 70–99)
Potassium: 4.6 meq/L (ref 3.5–5.1)
Sodium: 137 meq/L (ref 135–145)

## 2023-07-15 LAB — BRAIN NATRIURETIC PEPTIDE: Pro B Natriuretic peptide (BNP): 19 pg/mL (ref 0.0–100.0)

## 2023-07-15 MED ORDER — MONTELUKAST SODIUM 10 MG PO TABS: 10.0000 mg | ORAL_TABLET | Freq: Every day | ORAL | 11 refills | Status: AC

## 2023-07-15 NOTE — Patient Instructions (Addendum)
Continue Albuterol inhaler 2 puffs or 3 mL neb every 6 hours as needed for shortness of breath or wheezing. Notify if symptoms persist despite rescue inhaler/neb use. Continue Breztri 2 puffs Twice daily. Brush tongue and rinse mouth afterwards Continue claritin daily  Continue supplemental oxygen 4 lpm with activity and at night. You need to wear this whenever you are up doing activities. Unfortunately, the POC machine is not working for you at this point. Use portable oxygen tanks - ordered today. Goal >88-90%   Singulair (montelukast) 1 tab At bedtime for allergies  Work on quitting smoking.   Labs today  Depending on your labs, we may refer you to a heart doctor   Referral to lung cancer screening previously placed - someone should contact you the first of the year to set this appointment and your CT up   Follow up in 6-8 weeks with Dr. Isaiah Serge or Philis Nettle. If symptoms do not improve or worsen, please contact office for sooner follow up or seek emergency care.

## 2023-07-15 NOTE — Progress Notes (Signed)
@Patient  ID: Jose Welch, male    DOB: [birth year], 67 y.o.   MRN: [id number]  Chief Complaint  Patient presents with   Follow-up    Pt states he is still occasionally on o2, pt breathing is still the same.     Referring provider: Georgann Housekeeper, MD  HPI: 67 year old male, active smoker followed for COPD and chronic respiratory failure on supplemental O2. He is a patient of Jose Welch and last seen in office 10/09/2022 by Jose Welch. Past medical history significant for carotid stenosis, HLD, DM, anxiety, protein-calorie malnutrition.   TEST/EVENTS:  01/2022 IgE 897, Eos 500, A1AT 152/MM 04/03/2022 PFT: FVC 68, FEV1 44, ratio 50, TLC 113, DLCO 29.  Severe obstructive airway disease with reversibility.  Severe diffusing defect 10/30/2022 CT chest without contrast: Atherosclerosis.  Small mediastinal lymph nodes, within normal limits.  Biapical pleural-parenchymal scarring.  Severe centrilobular and paraseptal emphysema.  Mild scarring in the medial right lower lobe.  3 mm right lower lobe nodule no longer evident.  Scattered right lower lobe nodules measuring up to 6 mm, considered benign.  10/09/2022: Jose Welch with Jose Welch. Presents to qualify for POC. Current smoker, keeps a chronic cough which is Welch prominent in the morning. Maintained on Breztri. Uses albuterol 1-2 times a day. Been on O2 since May 2023. Uses oxygen 3-4 lpm as needed. Difficult to carry tanks. PFTs in August with severe obstruction and positive bronchodilator response. Advised to use mucinex to help loosen congestion. Qualified for POC >> dropped to 86% after walk 250 ft on room air; needed 3 lpm to maintain >88-90%. CT chest scheduled for 10/30/2022.   07/15/2023: Today - follow up Discussed the use of AI scribe software for clinical note transcription with the patient, who gave verbal consent to proceed.  History of Present Illness   The patient, with a history of severe COPD and chronic respiratory failure, presents with  complaints of shortness of breath that worsens with activity. He reports needing to rest after taking care of his dog and experiencing some shortness of breath when walking to the exam room. He feels like his breathing is at his baseline. He is currently on a regimen of Breztri, two puffs twice a day, and uses albuterol rescue inhaler, once or twice daily. He also uses oxygen therapy, with a home unit set to 6 lpm and a portable concentrator set to 5 lpm, which only lasts an hour. He has a daily cough, mostly in AM. Minimal clear phlegm. No issues with wheezing or chest congestion.  No hemoptysis, weight loss, anorexia, night sweats, fevers.   The patient has attempted smoking cessation with Chantix and nicotine patches, but discontinued both due to ineffectiveness and skin irritation, respectively. He continues to smoke about a pack a day.  He also reports frequent sneezing, which he manages with Claritin. He has occasional nasal congestion and drainage. No headaches, sore throat or ear pain.   The patient has noticed some swelling in his ankles, which has been ongoing for a few months. He has been advised by his primary care provider to keep his feet elevated. He denies any orthopnea, weight gain, PND, chest pain, palpitations.       No Known Allergies  Immunization History  Administered Date(s) Administered   Influenza Split 06/15/2014, 05/17/2015, 07/04/2017, 06/10/2020, 06/02/2021   Influenza, Seasonal, Injecte, Preservative Fre 05/22/2016   Influenza,inj,Quad PF,6+ Mos 05/22/2018, 05/27/2019   PFIZER(Purple Top)SARS-COV-2 Vaccination 11/26/2019, 06/21/2020   Pfizer Covid-19 Firefighter  Booster 12yrs & up 09/24/2021   Pneumococcal Polysaccharide-23 03/03/2014, 08/31/2017   Td 01/19/2006   Tdap 04/16/2016   Zoster, Live 04/05/2015    Past Medical History:  Diagnosis Date   Anxiety    Arthritis    Right wrist   Carotid artery occlusion    Carotid stenosis, left    COPD  (chronic obstructive pulmonary disease) (HCC)    Diabetes mellitus without complication (HCC)    Type II   GERD (gastroesophageal reflux disease)    History of hiatal hernia     Tobacco History: Social History   Tobacco Use  Smoking Status Every Day   Current packs/day: 1.00   Types: Cigarettes  Smokeless Tobacco Never   Ready to quit: Not Answered Counseling given: Not Answered   Outpatient Medications Prior to Visit  Medication Sig Dispense Refill   albuterol (VENTOLIN HFA) 108 (90 Base) MCG/ACT inhaler Inhale 1 puff into the lungs every 6 (six) hours as needed for shortness of breath.     ALPRAZolam (XANAX) 0.25 MG tablet TAKE 1 TABLET BY MOUTH TWICE DAILY AS NEEDED FOR 30 DAYS     atorvastatin (LIPITOR) 10 MG tablet Take 10 mg by mouth daily.     BREZTRI AEROSPHERE 160-9-4.8 MCG/ACT AERO Inhale 2 puffs into the lungs in the morning and at bedtime.     diclofenac Sodium (VOLTAREN) 1 % GEL Apply 4 g topically 4 (four) times daily. (Patient taking differently: Apply 4 g topically daily as needed (for pain).) 100 g 0   famotidine (PEPCID) 20 MG tablet Take 20 mg by mouth daily as needed for heartburn.     fluticasone (FLONASE) 50 MCG/ACT nasal spray Place 1 spray into both nostrils daily as needed for allergies.     meloxicam (MOBIC) 7.5 MG tablet Take 1 tablet (7.5 mg total) by mouth daily. 14 tablet 0   metFORMIN (GLUCOPHAGE) 500 MG tablet Take 500 mg by mouth 2 (two) times daily with a meal.     ONE TOUCH ULTRA TEST test strip      ONETOUCH DELICA LANCETS FINE MISC      nicotine (NICODERM CQ - DOSED IN MG/24 HOURS) 21 mg/24hr patch Place 1 patch (21 mg total) onto the skin daily. 28 patch 1   varenicline (CHANTIX PAK) 0.5 MG X 11 & 1 MG X 42 tablet Take one 0.5 mg tablet by mouth once daily for 3 days, then increase to one 0.5 mg tablet twice daily for 4 days, then increase to one 1 mg tablet twice daily. 53 tablet 0   No facility-administered medications prior to visit.      Review of Systems:   Constitutional: No weight loss or gain, night sweats, fevers, chills, fatigue, or lassitude. HEENT: No headaches, difficulty swallowing, tooth/dental problems, or sore throat. No itching, ear ache. +sneezing, nasal congestion, post nasal drip CV:  +swelling in lower extremities. No chest pain, orthopnea, PND, anasarca, dizziness, palpitations, syncope Resp: +shortness of breath with exertion; AM cough (baseline). No excess mucus or change in color of mucus. No hemoptysis. No wheezing.  No chest wall deformity GI:  No heartburn, indigestion, abdominal pain, nausea, vomiting, diarrhea, change in bowel habits, loss of appetite, bloody stools.  GU: No dysuria, change in color of urine, urgency or frequency.  No flank pain, no hematuria  Skin: No rash, lesions, ulcerations MSK:  No joint pain or swelling.  No decreased range of motion.  No back pain. Neuro: No dizziness or lightheadedness.  Psych: No  depression or anxiety. Mood stable.     Physical Exam:  BP (!) 149/84   Pulse 88   Wt 120 lb 6.4 oz (54.6 kg)   SpO2 90%   BMI 18.86 kg/m   GEN: Pleasant, interactive, well-kempt; in no acute distress HEENT:  Normocephalic and atraumatic. PERRLA. Sclera white. Nasal turbinates pink, moist and patent bilaterally. No rhinorrhea present. Oropharynx pink and moist, without exudate or edema. No lesions, ulcerations, or postnasal drip.  NECK:  Supple w/ fair ROM. No JVD present. Normal carotid impulses w/o bruits. Thyroid symmetrical with no goiter or nodules palpated. No lymphadenopathy.   CV: RRR, no m/r/g. +1 BLE edema. Pulses intact, +2 bilaterally. No cyanosis, pallor or clubbing. PULMONARY:  Unlabored, regular breathing. Diminished b/l A&P w/o wheezes/rales/rhonchi. No accessory muscle use.  GI: BS present and normoactive. Soft, non-tender to palpation. No organomegaly or masses detected.  MSK: No erythema, warmth or tenderness. Cap refil <2 sec all extrem. No  deformities or joint swelling noted. Muscle wasting Neuro: A/Ox3. No focal deficits noted.   Skin: Warm, no lesions or rashe Psych: Normal affect and behavior. Judgement and thought content appropriate.     Lab Results:  CBC    Component Value Date/Time   WBC 11.0 (H) 01/24/2022 1524   RBC 4.39 01/24/2022 1524   HGB 12.1 (L) 01/24/2022 1524   HCT 37.9 (L) 01/24/2022 1524   PLT 265.0 01/24/2022 1524   MCV 86.3 01/24/2022 1524   MCH 27.7 01/11/2022 0258   MCHC 31.8 01/24/2022 1524   RDW 15.5 01/24/2022 1524   LYMPHSABS 2.0 01/24/2022 1524   MONOABS 0.6 01/24/2022 1524   EOSABS 0.1 01/24/2022 1524   BASOSABS 0.1 01/24/2022 1524    BMET    Component Value Date/Time   NA 134 (L) 01/11/2022 0258   K 4.4 01/11/2022 0258   CL 101 01/11/2022 0258   CO2 23 01/11/2022 0258   GLUCOSE 157 (H) 01/11/2022 0258   BUN 18 01/11/2022 0258   CREATININE 0.93 01/11/2022 0258   CALCIUM 9.2 01/11/2022 0258   GFRNONAA >60 01/11/2022 0258   GFRAA >60 08/31/2017 0316    BNP    Component Value Date/Time   BNP 26.8 01/10/2022 1654     Imaging:  No results found.  Administration History     None          Latest Ref Rng & Units 04/03/2022    8:48 AM  PFT Results  FVC-Pre L 2.79   FVC-Predicted Pre % 68   FVC-Post L 3.05   FVC-Predicted Post % 75   Pre FEV1/FVC % % 48   Post FEV1/FCV % % 50   FEV1-Pre L 1.34   FEV1-Predicted Pre % 44   FEV1-Post L 1.53   DLCO uncorrected ml/min/mmHg 7.00   DLCO UNC% % 29   DLCO corrected ml/min/mmHg 7.00   DLCO COR %Predicted % 29   DLVA Predicted % 32   TLC L 7.25   TLC % Predicted % 113   RV % Predicted % 175     No results found for: "NITRICOXIDE"      Assessment & Plan:      Chronic Obstructive Pulmonary Disease (COPD) COPD with high symptom burden. Continues to smoke heavily, which contributes to symptoms. Using albuterol rescue inhaler 1-2 times daily and Breztri 2 puffs twice daily. Smoking cessation strongly advised.  No recent exacerbations or hospitalizations. Not acutely exacerbating today. Will add on Singulair for allergy-mediated airway inflammation/trigger prevention (previous IgE 897,  Eos 500). Side effect profile reviewed. Action plan in place - Conduct walk test to requalify for oxygen therapy - Order chest x-ray and labs (BNP, BMET, allergen panel) - Prescribe Singulair 10 mg at bedtime - Schedule follow-up in 6-8 weeks - Call with lab and x-ray results, and any pertinent changes in care  Allergic rhinitis Significantly elevated IgE with peripheral eosinophilia. Continue daily antihistamine. Add on singulair.  - Check allergen panel  - See above   Peripheral Edema BLE edema on exam. No documented echocardiogram in chart to review. Discussed possible heart failure or PH component given severe lung disease, and need for further evaluation. - BNP and BMET today - If BNP elevated, will obtain echocardiogram for further evaluation   Lung Cancer Screening Smoking history with CT scan in February 2024 stable. Discussed yearly low dose CT chest with lung cancer screening program. Referral was previously placed. Due February 2025.  - Enroll in lung cancer screening program once contacted  - Call if no appointment made by the beginning of the year   Current smoker  Smoking ~1 pack/day. Previous Chantix and nicotine patches unsuccessful due to side effects and lack of efficacy.  - Discuss alternative smoking cessation strategies   Chronic respiratory failure with hypoxia Unable to maintain on pulse dosed oxygen today. Required 4 lpm supplemental O2 with exertion. Discussed importance of compliance with oxygen use and risks of untreated hypoxia. Encouraged to utilize portable tanks with continuous flow O2 when out of the house. He does not appear to be acutely exacerbating today; however, with new onset volume overload over the last few months, could be dealing with a component of HF or PH. See above  plan. Goal >88-90% - Continue supplemental oxygen 4 lpm with activity and at rest with continuous flow O2  Follow-up - Follow up in 6-8 weeks with Dr. Isaiah Serge or Philis Nettle  - Will call for next steps after labs and CXR result       Advised if symptoms do not improve or worsen, to please contact office for sooner follow up or seek emergency care.   I spent 45 minutes of dedicated to the care of this patient on the date of this encounter to include pre-visit review of records, face-to-face time with the patient discussing conditions above, post visit ordering of testing, clinical documentation with the electronic health record, making appropriate referrals as documented, and communicating necessary findings to members of the patients care team.  Noemi Chapel, Welch 07/15/2023  Pt aware and understands Welch's role.

## 2023-07-17 ENCOUNTER — Telehealth: Payer: Self-pay | Admitting: Nurse Practitioner

## 2023-07-17 DIAGNOSIS — J9601 Acute respiratory failure with hypoxia: Secondary | ICD-10-CM | POA: Diagnosis not present

## 2023-07-17 DIAGNOSIS — Z720 Tobacco use: Secondary | ICD-10-CM

## 2023-07-17 DIAGNOSIS — I6522 Occlusion and stenosis of left carotid artery: Secondary | ICD-10-CM

## 2023-07-17 DIAGNOSIS — J9611 Chronic respiratory failure with hypoxia: Secondary | ICD-10-CM

## 2023-07-17 DIAGNOSIS — J449 Chronic obstructive pulmonary disease, unspecified: Secondary | ICD-10-CM | POA: Diagnosis not present

## 2023-07-17 NOTE — Telephone Encounter (Signed)
Patient checking on referral for heart doctor. Also CT scan for February 2025. Patient phone number is [phone].

## 2023-07-18 DIAGNOSIS — F17210 Nicotine dependence, cigarettes, uncomplicated: Secondary | ICD-10-CM | POA: Diagnosis not present

## 2023-07-18 DIAGNOSIS — R2243 Localized swelling, mass and lump, lower limb, bilateral: Secondary | ICD-10-CM | POA: Diagnosis not present

## 2023-07-18 DIAGNOSIS — J9611 Chronic respiratory failure with hypoxia: Secondary | ICD-10-CM | POA: Diagnosis not present

## 2023-07-18 DIAGNOSIS — J449 Chronic obstructive pulmonary disease, unspecified: Secondary | ICD-10-CM | POA: Diagnosis not present

## 2023-07-19 LAB — ALLERGEN PANEL (27) + IGE
Alternaria Alternata IgE: <0.1 kU/L
Aspergillus Fumigatus IgE: <0.1 kU/L
Bahia Grass IgE: 0.12 kU/L — AB
Bermuda Grass IgE: 0.2 kU/L — AB
Cat Dander IgE: <0.1 kU/L
Cedar, Mountain IgE: 0.17 kU/L — AB
Cladosporium Herbarum IgE: <0.1 kU/L
Cocklebur IgE: 0.17 kU/L — AB
Cockroach, American IgE: 0.3 kU/L — AB
Common Silver Birch IgE: <0.1 kU/L
D Farinae IgE: 0.12 kU/L — AB
D Pteronyssinus IgE: <0.1 kU/L
Dog Dander IgE: 0.1 kU/L — AB
Elm, American IgE: 0.2 kU/L — AB
Hickory, White IgE: <0.1 kU/L
IgE (Immunoglobulin E), Serum: 1612 [IU]/mL — ABNORMAL HIGH (ref 6–495)
Johnson Grass IgE: 0.17 kU/L — AB
Kentucky Bluegrass IgE: 2.05 kU/L — AB
Maple/Box Elder IgE: 0.12 kU/L — AB
Mucor Racemosus IgE: <0.1 kU/L
Oak, White IgE: 0.14 kU/L — AB
Penicillium Chrysogen IgE: <0.1 kU/L
Pigweed, Rough IgE: 0.19 kU/L — AB
Plantain, English IgE: 0.12 kU/L — AB
Ragweed, Short IgE: 0.13 kU/L — AB
Setomelanomma Rostrat: <0.1 kU/L
Timothy Grass IgE: 0.67 kU/L — AB
White Mulberry IgE: <0.1 kU/L

## 2023-07-22 NOTE — Telephone Encounter (Signed)
Called and spoke with pt. Jose Welch OV said referral for [facility], which has been placed and order for echo. Pt made aware and verbalized understanding. Nfn

## 2023-07-29 ENCOUNTER — Other Ambulatory Visit: Payer: Self-pay | Admitting: Nurse Practitioner

## 2023-07-29 DIAGNOSIS — R768 Other specified abnormal immunological findings in serum: Secondary | ICD-10-CM

## 2023-07-29 DIAGNOSIS — J3089 Other allergic rhinitis: Secondary | ICD-10-CM

## 2023-07-31 ENCOUNTER — Telehealth: Payer: Self-pay

## 2023-07-31 ENCOUNTER — Telehealth: Payer: Self-pay | Admitting: Nurse Practitioner

## 2023-07-31 NOTE — Telephone Encounter (Signed)
Patient is returning call to [facility]. Please advise.

## 2023-07-31 NOTE — Telephone Encounter (Signed)
Pt returning missed call. 

## 2023-07-31 NOTE — Telephone Encounter (Signed)
Patient calling in bc he is worried he will have to pay a co-pay for his echocardiogram at [facility]. Informed pt he does'nt have a co-pay and that if he does we can bill it to him and he can pay it at his earliest convience

## 2023-08-05 NOTE — Telephone Encounter (Signed)
Patient notified no co-pay but if there was one he could be billed on the back end.

## 2023-08-12 NOTE — Telephone Encounter (Signed)
Spoke to patient regarding his insurance questions.

## 2023-08-13 ENCOUNTER — Ambulatory Visit (INDEPENDENT_AMBULATORY_CARE_PROVIDER_SITE_OTHER): Payer: No Typology Code available for payment source

## 2023-08-13 DIAGNOSIS — I5030 Unspecified diastolic (congestive) heart failure: Secondary | ICD-10-CM

## 2023-08-13 DIAGNOSIS — I6522 Occlusion and stenosis of left carotid artery: Secondary | ICD-10-CM | POA: Diagnosis not present

## 2023-08-13 DIAGNOSIS — I77810 Thoracic aortic ectasia: Secondary | ICD-10-CM

## 2023-08-13 DIAGNOSIS — J9611 Chronic respiratory failure with hypoxia: Secondary | ICD-10-CM | POA: Diagnosis not present

## 2023-08-13 LAB — ECHOCARDIOGRAM COMPLETE
Area-P 1/2: 5.27 cm2
S' Lateral: 2.15 cm

## 2023-08-13 MED ORDER — PERFLUTREN LIPID MICROSPHERE
1.0000 mL | INTRAVENOUS | Status: AC | PRN
  Administered 2023-08-13: 3 mL via INTRAVENOUS

## 2023-08-16 DIAGNOSIS — J9601 Acute respiratory failure with hypoxia: Secondary | ICD-10-CM | POA: Diagnosis not present

## 2023-08-16 DIAGNOSIS — J449 Chronic obstructive pulmonary disease, unspecified: Secondary | ICD-10-CM | POA: Diagnosis not present

## 2023-09-16 ENCOUNTER — Encounter: Payer: Self-pay | Admitting: Nurse Practitioner

## 2023-09-16 ENCOUNTER — Ambulatory Visit: Payer: No Typology Code available for payment source | Admitting: Nurse Practitioner

## 2023-09-16 VITALS — BP 112/62 | HR 112 | Resp 18 | Ht 67.0 in | Wt 109.8 lb

## 2023-09-16 DIAGNOSIS — J301 Allergic rhinitis due to pollen: Secondary | ICD-10-CM | POA: Diagnosis not present

## 2023-09-16 DIAGNOSIS — R600 Localized edema: Secondary | ICD-10-CM

## 2023-09-16 DIAGNOSIS — F17210 Nicotine dependence, cigarettes, uncomplicated: Secondary | ICD-10-CM

## 2023-09-16 DIAGNOSIS — J9601 Acute respiratory failure with hypoxia: Secondary | ICD-10-CM | POA: Diagnosis not present

## 2023-09-16 DIAGNOSIS — J9611 Chronic respiratory failure with hypoxia: Secondary | ICD-10-CM

## 2023-09-16 DIAGNOSIS — J449 Chronic obstructive pulmonary disease, unspecified: Secondary | ICD-10-CM

## 2023-09-16 DIAGNOSIS — R252 Cramp and spasm: Secondary | ICD-10-CM | POA: Diagnosis not present

## 2023-09-16 DIAGNOSIS — I5189 Other ill-defined heart diseases: Secondary | ICD-10-CM

## 2023-09-16 DIAGNOSIS — Z720 Tobacco use: Secondary | ICD-10-CM

## 2023-09-16 DIAGNOSIS — E113291 Type 2 diabetes mellitus with mild nonproliferative diabetic retinopathy without macular edema, right eye: Secondary | ICD-10-CM | POA: Diagnosis not present

## 2023-09-16 NOTE — Patient Instructions (Addendum)
 Continue Albuterol  inhaler 2 puffs or 3 mL neb every 6 hours as needed for shortness of breath or wheezing. Notify if symptoms persist despite rescue inhaler/neb use. Continue Breztri 2 puffs Twice daily. Brush tongue and rinse mouth afterwards Continue claritin daily  Continue supplemental oxygen  4 lpm with activity and at night. You need to wear this whenever you are up doing activities. Use portable oxygen  tanks - ordered at your last visit. Will follow up with your medical supply company on this. Continue singulair  (montelukast ) 1 tab At bedtime for allergies   Work on quitting smoking.   Referral to heart doctor    Referral to lung cancer screening previously placed - someone should contact you within the next few weeks to schedule this. If not, please call us    Call allergist to set up appointment - [phone]    Follow up in 4 months with Jose Welch or Jose Welch. If symptoms do not improve or worsen, please contact office for sooner follow up or seek emergency care.

## 2023-09-16 NOTE — Progress Notes (Signed)
 @Patient  ID: Jose Welch, male    DOB: February 04, 1956, 68 y.o.   MRN: [id number]  Chief Complaint  Patient presents with   Follow-up    Breathing is about the same as last visit. Said he was given fluid pills for swelling ankles he got four and needs more.     Referring provider: Ransom Other, MD  HPI: 68 year old male, active smoker followed for COPD and chronic respiratory failure on supplemental O2. He is a patient of Dr. Jiles and last seen in office 07/15/2023 by Malachy NP. Past medical history significant for carotid stenosis, HLD, DM, anxiety, protein-calorie malnutrition.   TEST/EVENTS:  01/2022 IgE 897, Eos 100, A1AT 152/MM 04/03/2022 PFT: FVC 68, FEV1 44, ratio 50, TLC 113, DLCO 29.  Severe obstructive airway disease with reversibility.  Severe diffusing defect 10/30/2022 CT chest without contrast: Atherosclerosis.  Small mediastinal lymph nodes, within normal limits.  Biapical pleural-parenchymal scarring.  Severe centrilobular and paraseptal emphysema.  Mild scarring in the medial right lower lobe.  3 mm right lower lobe nodule no longer evident.  Scattered right lower lobe nodules measuring up to 6 mm, considered benign. 07/15/2023 positive RAST grasses, trees, dust, dog, cockroach, trees; IgE 1,612 08/13/2023 echo: EF 60-65%, GIDD. RV size and function nl. Aortic dilatation of ascending aorta, measuring 38 mm.   10/09/2022: [name] with Hope NP. Presents to qualify for POC. Current smoker, keeps a chronic cough which is more prominent in the morning. Maintained on Breztri. Uses albuterol  1-2 times a day. Been on O2 since May 2023. Uses oxygen  3-4 lpm as needed. Difficult to carry tanks. PFTs in August with severe obstruction and positive bronchodilator response. Advised to use mucinex  to help loosen congestion. Qualified for POC >> dropped to 86% after walk 250 ft on room air; needed 3 lpm to maintain >88-90%. CT chest scheduled for 10/30/2022.   07/15/2023: OV with [name] NP. The  patient, with a history of severe COPD and chronic respiratory failure, presents with complaints of shortness of breath that worsens with activity. He reports needing to rest after taking care of his dog and experiencing some shortness of breath when walking to the exam room. He feels like his breathing is at his baseline. He is currently on a regimen of Breztri, two puffs twice a day, and uses albuterol  rescue inhaler, once or twice daily. He also uses oxygen  therapy, with a home unit set to 6 lpm and a portable concentrator set to 5 lpm, which only lasts an hour. He has a daily cough, mostly in AM. Minimal clear phlegm. No issues with wheezing or chest congestion.  No hemoptysis, weight loss, anorexia, night sweats, fevers.  The patient has attempted smoking cessation with Chantix  and nicotine  patches, but discontinued both due to ineffectiveness and skin irritation, respectively. He continues to smoke about a pack a day. He also reports frequent sneezing, which he manages with Claritin. He has occasional nasal congestion and drainage. No headaches, sore throat or ear pain.  The patient has noticed some swelling in his ankles, which has been ongoing for a few months. He has been advised by his primary care provider to keep his feet elevated. He denies any orthopnea, weight gain, PND, chest pain, palpitations  09/16/2023: Today - follow up Patient presents today for follow up. After his last visit, we referred him to allergy due to positive RAST and elevated IgE. He never scheduled this appointment. He has frequent sneezing, nasal congestion and drainage. He did start  singulair , which has been helping some.  He also had an echocardiogram, which revealed GIDD and a dilated ascending aorta. He has not seen cardiology before. He does have swelling in his legs that he occasionally takes lasix for. He denies orthopnea, chest pain, palpitations.  Regarding his breathing, he feels like it is stable. He gets short  winded with most activities, which is baseline for him. Usually has to stop and rest after longer distances or more strenuous activities. He has a daily cough in the AM with clear sputum. No fevers, chills, hemoptysis, weight loss, anorexia. Using his albuterol  1-2 times a day. On Brztri.  He does not have his oxygen  on today. He does try to wear it when he goes out. More consistent at home with his concentrator. He tells me that [employer] said he needed a new order; this was placed at his last visit.    No Known Allergies  Immunization History  Administered Date(s) Administered   Influenza Split 06/15/2014, 05/17/2015, 07/04/2017, 06/10/2020, 06/02/2021   Influenza, Seasonal, Injecte, Preservative Fre 05/22/2016   Influenza,inj,Quad PF,6+ Mos 05/22/2018, 05/27/2019   PFIZER(Purple Top)SARS-COV-2 Vaccination 11/26/2019, 06/21/2020   Pfizer Covid-19 Vaccine Bivalent Booster 36yrs & up 09/24/2021   Pneumococcal Polysaccharide-23 03/03/2014, 08/31/2017   Td 01/19/2006   Tdap 04/16/2016   Zoster, Live 04/05/2015    Past Medical History:  Diagnosis Date   Anxiety    Arthritis    Right wrist   Carotid artery occlusion    Carotid stenosis, left    COPD (chronic obstructive pulmonary disease) (HCC)    Diabetes mellitus without complication (HCC)    Type II   GERD (gastroesophageal reflux disease)    History of hiatal hernia     Tobacco History: Social History   Tobacco Use  Smoking Status Every Day   Current packs/day: 1.00   Types: Cigarettes  Smokeless Tobacco Never   Ready to quit: Not Answered Counseling given: Not Answered   Outpatient Medications Prior to Visit  Medication Sig Dispense Refill   albuterol  (VENTOLIN  HFA) 108 (90 Base) MCG/ACT inhaler Inhale 1 puff into the lungs every 6 (six) hours as needed for shortness of breath.     ALPRAZolam (XANAX) 0.25 MG tablet 0.5 mg.     atorvastatin  (LIPITOR) 10 MG tablet Take 10 mg by mouth daily.     BREZTRI AEROSPHERE  160-9-4.8 MCG/ACT AERO Inhale 2 puffs into the lungs in the morning and at bedtime.     diclofenac  Sodium (VOLTAREN ) 1 % GEL Apply 4 g topically 4 (four) times daily. (Patient taking differently: Apply 4 g topically daily as needed (for pain).) 100 g 0   famotidine  (PEPCID ) 20 MG tablet Take 20 mg by mouth daily as needed for heartburn.     fluticasone  (FLONASE) 50 MCG/ACT nasal spray Place 1 spray into both nostrils daily as needed for allergies.     meloxicam  (MOBIC ) 7.5 MG tablet Take 1 tablet (7.5 mg total) by mouth daily. 14 tablet 0   metFORMIN  (GLUCOPHAGE ) 500 MG tablet Take 500 mg by mouth 2 (two) times daily with a meal.     montelukast  (SINGULAIR ) 10 MG tablet Take 1 tablet (10 mg total) by mouth at bedtime. 30 tablet 11   ONE TOUCH ULTRA TEST test strip      ONETOUCH DELICA LANCETS FINE MISC      No facility-administered medications prior to visit.     Review of Systems:   Constitutional: No weight loss or gain, night sweats, fevers,  chills, fatigue, or lassitude. HEENT: No headaches, difficulty swallowing, tooth/dental problems, or sore throat. No itching, ear ache. +sneezing, nasal congestion, post nasal drip CV:  +swelling in lower extremities. No chest pain, orthopnea, PND, anasarca, dizziness, palpitations, syncope Resp: +shortness of breath with exertion; AM cough (baseline). No excess mucus or change in color of mucus. No hemoptysis. No wheezing.  No chest wall deformity GI:  No heartburn, indigestion, abdominal pain, nausea, vomiting, diarrhea, change in bowel habits, loss of appetite, bloody stools.  GU: No dysuria, change in color of urine, urgency or frequency.  No flank pain, no hematuria  Skin: No rash, lesions, ulcerations MSK:  No joint pain or swelling.  No decreased range of motion.  No back pain. Neuro: No dizziness or lightheadedness.  Psych: No depression or anxiety. Mood stable.     Physical Exam:  BP 112/62   Pulse (!) 112   Resp 18   Ht 5' 7 (1.702  m)   Wt 109 lb 12.8 oz (49.8 kg)   SpO2 92%   BMI 17.20 kg/m   GEN: Pleasant, interactive, well-kempt; in no acute distress HEENT:  Normocephalic and atraumatic. PERRLA. Sclera white. Nasal turbinates pink, moist and patent bilaterally. No rhinorrhea present. Oropharynx pink and moist, without exudate or edema. No lesions, ulcerations, or postnasal drip.  NECK:  Supple w/ fair ROM. No JVD present. Normal carotid impulses w/o bruits. Thyroid symmetrical with no goiter or nodules palpated. No lymphadenopathy.   CV: RRR, no m/r/g. +1 BLE edema. Pulses intact, +2 bilaterally. No cyanosis, pallor or clubbing. PULMONARY:  Unlabored, regular breathing. Diminished b/l A&P w/o wheezes/rales/rhonchi. No accessory muscle use.  GI: BS present and normoactive. Soft, non-tender to palpation. No organomegaly or masses detected.  MSK: No erythema, warmth or tenderness. Cap refil <2 sec all extrem. No deformities or joint swelling noted. Muscle wasting Neuro: A/Ox3. No focal deficits noted.   Skin: Warm, no lesions or rashe Psych: Normal affect and behavior. Judgement and thought content appropriate.     Lab Results:  CBC    Component Value Date/Time   WBC 11.0 (H) 01/24/2022 1524   RBC 4.39 01/24/2022 1524   HGB 12.1 (L) 01/24/2022 1524   HCT 37.9 (L) 01/24/2022 1524   PLT 265.0 01/24/2022 1524   MCV 86.3 01/24/2022 1524   MCH 27.7 01/11/2022 0258   MCHC 31.8 01/24/2022 1524   RDW 15.5 01/24/2022 1524   LYMPHSABS 2.0 01/24/2022 1524   MONOABS 0.6 01/24/2022 1524   EOSABS 0.1 01/24/2022 1524   BASOSABS 0.1 01/24/2022 1524    BMET    Component Value Date/Time   NA 137 07/15/2023 1158   K 4.6 07/15/2023 1158   CL 100 07/15/2023 1158   CO2 25 07/15/2023 1158   GLUCOSE 90 07/15/2023 1158   BUN 17 07/15/2023 1158   CREATININE 0.79 07/15/2023 1158   CALCIUM  9.6 07/15/2023 1158   GFRNONAA >60 01/11/2022 0258   GFRAA >60 08/31/2017 0316    BNP    Component Value Date/Time   BNP 26.8  01/10/2022 1654     Imaging:  No results found.  perflutren  lipid microspheres (DEFINITY ) IV suspension     Date Action Dose Route User   08/13/2023 1631 Given 3 mL Intravenous Elliott, Johanna R          Latest Ref Rng & Units 04/03/2022    8:48 AM  PFT Results  FVC-Pre L 2.79   FVC-Predicted Pre % 68   FVC-Post L 3.05  FVC-Predicted Post % 75   Pre FEV1/FVC % % 48   Post FEV1/FCV % % 50   FEV1-Pre L 1.34   FEV1-Predicted Pre % 44   FEV1-Post L 1.53   DLCO uncorrected ml/min/mmHg 7.00   DLCO UNC% % 29   DLCO corrected ml/min/mmHg 7.00   DLCO COR %Predicted % 29   DLVA Predicted % 32   TLC L 7.25   TLC % Predicted % 113   RV % Predicted % 175     No results found for: NITRICOXIDE      Assessment & Plan:      Chronic Obstructive Pulmonary Disease (COPD) COPD with high symptom burden. Continues to smoke heavily, which contributes to symptoms. Using albuterol  rescue inhaler 1-2 times daily and Breztri 2 puffs twice daily. Smoking cessation strongly advised. No recent exacerbations or hospitalizations. Not acutely exacerbating today. Added on Singulair  at last OV for allergy-mediated airway inflammation/trigger prevention - tolerating well. Understands to notify of any significant mood changes. Action plan in place - Continue Breztri 2 puffs bid - Continue PRN albuterol   - Continue Singulair  10 mg at bedtime  Allergic rhinitis Significantly elevated IgE and positive RAST. Continue current regimen. Contact allergist to schedule consultation.  - Follow up on referral to allergist - Continue daily antihistamine - Continue singulair  daily   Diastolic dysfunction  BLE edema on exam; stable. Echo with normal EF and GIDD. Recent BNP nl.  - Referral to cardiology - Continue lasix as prescribed by PCP  Lung Cancer Screening Smoking history with CT scan in February 2024 stable. Discussed yearly low dose CT chest with lung cancer screening program. Referral was  previously placed. Due February 2025.  - Call if no appointment made within next few weeks  Current smoker  Smoking ~1 pack/day. Previous Chantix  and nicotine  patches unsuccessful due to side effects and lack of efficacy.  - Discuss alternative smoking cessation strategies   Chronic respiratory failure with hypoxia Walk test in November 2024 for requalification. Unable to maintain on pulse dosed oxygen  today. Required 4 lpm continuous supplemental O2 with exertion. Orders were placed at last OV - will follow up withhis DME company regarding this. Discussed importance of compliance with oxygen  use and risks of untreated hypoxia. Encouraged to utilize portable tanks with continuous flow O2 when out of the house. Goal >88-90% - Continue supplemental oxygen  4 lpm with activity and at rest with continuous flow O2  Follow-up - Follow up in 4 months with Dr. Theophilus or Izetta Malachy PIETY    Advised if symptoms do not improve or worsen, to please contact office for sooner follow up or seek emergency care.   I spent 35 minutes of dedicated to the care of this patient on the date of this encounter to include pre-visit review of records, face-to-face time with the patient discussing conditions above, post visit ordering of testing, clinical documentation with the electronic health record, making appropriate referrals as documented, and communicating necessary findings to members of the patients care team.  Comer LULLA Malachy, NP 09/16/2023  Pt aware and understands NP's role.

## 2023-09-18 ENCOUNTER — Telehealth: Payer: Self-pay | Admitting: Nurse Practitioner

## 2023-09-18 DIAGNOSIS — Z1211 Encounter for screening for malignant neoplasm of colon: Secondary | ICD-10-CM | POA: Diagnosis not present

## 2023-09-18 DIAGNOSIS — Z1212 Encounter for screening for malignant neoplasm of rectum: Secondary | ICD-10-CM | POA: Diagnosis not present

## 2023-09-18 NOTE — Telephone Encounter (Signed)
 Patient states that [employer] needs notes in order for his short term disability to be extended.  Fax number [phone]  **Will try to fax if fax machine is working

## 2023-09-27 ENCOUNTER — Other Ambulatory Visit: Payer: Self-pay

## 2023-09-27 ENCOUNTER — Ambulatory Visit (INDEPENDENT_AMBULATORY_CARE_PROVIDER_SITE_OTHER): Payer: No Typology Code available for payment source | Admitting: Internal Medicine

## 2023-09-27 ENCOUNTER — Encounter: Payer: Self-pay | Admitting: Internal Medicine

## 2023-09-27 VITALS — BP 114/60 | HR 98 | Temp 98.6°F | Resp 12 | Ht 64.96 in | Wt 110.4 lb

## 2023-09-27 DIAGNOSIS — Z720 Tobacco use: Secondary | ICD-10-CM

## 2023-09-27 DIAGNOSIS — J449 Chronic obstructive pulmonary disease, unspecified: Secondary | ICD-10-CM

## 2023-09-27 DIAGNOSIS — J9611 Chronic respiratory failure with hypoxia: Secondary | ICD-10-CM | POA: Diagnosis not present

## 2023-09-27 DIAGNOSIS — J3089 Other allergic rhinitis: Secondary | ICD-10-CM | POA: Diagnosis not present

## 2023-09-27 MED ORDER — IPRATROPIUM BROMIDE 0.06 % NA SOLN: 2.0000 | Freq: Four times a day (QID) | NASAL | 12 refills | Status: AC

## 2023-09-27 MED ORDER — AZELASTINE HCL 0.1 % NA SOLN: 2.0000 | Freq: Two times a day (BID) | NASAL | 12 refills | Status: AC

## 2023-09-27 NOTE — Patient Instructions (Addendum)
Chronic Obstructive Pulmonary Disease (COPD) Stable since hospitalization in 2023. Currently on Breztri and supplemental oxygen. History of inconsistent medication use. Last exacerbation required hospitalization and was initially suspected to be pneumonia. - Breathing tests today showed: inflammation in young lungs that was not reversible  - Will recheck eosinophil count to evaluate for possible dupixent use  - Continue Breztri 2 puffs twice daily  - Continue albuterol 2 puffs every 4 hours as needed  - Continue use of oxygen as needed and directed by pulmonary   Chronic rhinitis  Reports of runny nose, possibly seasonal. Previous use of Flonase and Claritin with some relief. Blood work showed low positive results for dust mite, grass pollen, tree pollen, and weed pollen allergies. Cannot skin test due to severity of COPD  - Continue flonase 1 spray per nostril twice daily  - Continue claritin 10mg  daily as needed  - Continue Montelukast 10mg  daily  - Start Astelin 1 spray per nostril twice daily  - Start atovent 1 spray per nostril up to 4 times a day as needed for drainage  - Start avoidance measures below   Gastroesophageal Reflux Disease (GERD) Controlled with Pepcid -Continue current management with Peocid 20mg  daily   Tobacco Use Current smoker, 1 PPD  -Encourage smoking cessation as it can contribute to nasal congestion and exacerbate COPD.   Follow up: we will call you with lab results and go from there   Thank you so much for letting me partake in your care today.  Don't hesitate to reach out if you have any additional concerns!  Ferol Luz, MD  [facility] [place], [place]  DUST MITE AVOIDANCE MEASURES:  There are three main measures that need and can be taken to avoid house dust mites:  Reduce accumulation of dust in general -reduce furniture, clothing, carpeting, books, stuffed animals, especially in bedroom  Separate yourself from the dust -use  pillow and mattress encasements (can be found at stores such as [employer] or online) -avoid direct exposure to air condition flow -use a HEPA filter device, especially in the bedroom; you can also use a HEPA filter vacuum cleaner -wipe dust with a moist towel instead of a dry towel or broom when cleaning  Decrease mites and/or their secretions -wash clothing and linen and stuffed animals at highest temperature possible, at least every 2 weeks -stuffed animals can also be placed in a bag and put in a freezer overnight  Despite the above measures, it is impossible to eliminate dust mites or their allergen completely from your home.  With the above measures the burden of mites in your home can be diminished, with the goal of minimizing your allergic symptoms.  Success will be reached only when implementing and using all means together.  Reducing Pollen Exposure  The American Academy of All[employer] Asthma [employer] Immun[employer] suggests the following steps to reduce your exposure to pollen during allergy seasons.    Do not hang sheets or clothing out to dry; pollen may collect on these items. Do not mow lawns or spend time around freshly cut grass; mowing stirs up pollen. Keep windows closed at night.  Keep car windows closed while driving. Minimize morning activities outdoors, a time when pollen counts are usually at their highest. Stay indoors as much as possible when pollen counts or humidity is high and on windy days when pollen tends to remain in the air longer. Use air conditioning when possible.  Many air conditioners have filters  that trap the pollen spores. Use a HEPA room air filter to remove pollen form the indoor air you breathe.

## 2023-09-27 NOTE — Progress Notes (Signed)
NEW PATIENT Date of Service/Encounter:  09/27/23 Referring provider: Noemi Chapel, NP Primary care provider: Georgann Housekeeper, MD  Subjective:  Jose Welch is a 68 y.o. male  with PMHx of COPD on oxygen presenting today for evaluation of rhinitis and COPD History obtained from: chart review and patient.   Discussed the use of AI scribe software for clinical note transcription with the patient, who gave verbal consent to proceed.  History of Present Illness   The patient, with a known history of COPD managed by [facility]  presents for evaluation of potential allergies. He reports a chronic runny nose, which is particularly bothersome in the mornings. The patient also describes a cough that is most prominent in the morning and is associated with nasal drainage. The patient is unable to recall if these symptoms are seasonal or year-round. He has tried multiple nasal sprays, including Flonase, with limited improvement. The patient also takes Claritin and montelukast, but is unsure of his effectiveness.  The patient's COPD has been stable since a hospital admission in 2023, during which he was diagnosed with a COPD flare-up initially suspected to be pneumonia. The patient was started on oxygen therapy during this admission and continues to use it. He reports no significant changes in his COPD symptoms since this hospitalization. He is on breztri and is compliant.  Unclear how often he is using albuerol, but denies any systemic steroids since his hospitalizations. Pulm notes indicate a higher symptom burden   The patient is a current smoker, with a daily consumption of approximately one pack per day. 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 He also reports a history of reflux or heartburn, which is controlled with Xanax. The patient denies any prior sinus surgeries.        Chart Review:  07/15/23: IgE 1612 borderline sIgE  to dust mite, grass, tree, weed  01/2022 IgE 897, Eos 100,  A1AT 152/MM 04/03/2022 PFT: FVC 68, FEV1 44, ratio 50, TLC 113, DLCO 29.  Severe obstructive airway disease with reversibility.  Severe diffusing defect 10/30/2022 CT chest without contrast: Atherosclerosis.  Small mediastinal lymph nodes, within normal limits.  Biapical pleural-parenchymal scarring.  Severe centrilobular and paraseptal emphysema.  Mild scarring in the medial right lower lobe.  3 mm right lower lobe nodule no longer evident.  Scattered right lower lobe nodules measuring up to 6 mm, considered benign.   Other allergy screening: Asthma:  COPD  Rhino conjunctivitis: yes Food allergy: no Medication allergy: no Hymenoptera allergy: no Urticaria: no Eczema:no History of recurrent infections suggestive of immunodeficency: no Vaccinations are up to date.   Past Medical History: Past Medical History:  Diagnosis Date   Anxiety    Arthritis    Right wrist   Carotid artery occlusion    Carotid stenosis, left    COPD (chronic obstructive pulmonary disease) (HCC)    Diabetes mellitus without complication (HCC)    Type II   GERD (gastroesophageal reflux disease)    History of hiatal hernia    Medication List:  Current Outpatient Medications  Medication Sig Dispense Refill   albuterol (VENTOLIN HFA) 108 (90 Base) MCG/ACT inhaler Inhale 1 puff into the lungs every 6 (six) hours as needed for shortness of breath.     ALPRAZolam (XANAX) 0.25 MG tablet 0.5 mg.     atorvastatin (LIPITOR) 10 MG tablet Take 10 mg by mouth daily.     BREZTRI AEROSPHERE 160-9-4.8 MCG/ACT AERO Inhale 2 puffs into the lungs in the  morning and at bedtime.     diclofenac Sodium (VOLTAREN) 1 % GEL Apply 4 g topically 4 (four) times daily. (Patient taking differently: Apply 4 g topically daily as needed (for pain).) 100 g 0   famotidine (PEPCID) 20 MG tablet Take 20 mg by mouth daily as needed for heartburn.     fluticasone (FLONASE) 50 MCG/ACT nasal spray Place 1 spray into both nostrils daily as needed for  allergies.     meloxicam (MOBIC) 7.5 MG tablet Take 1 tablet (7.5 mg total) by mouth daily. 14 tablet 0   metFORMIN (GLUCOPHAGE) 500 MG tablet Take 500 mg by mouth 2 (two) times daily with a meal.     montelukast (SINGULAIR) 10 MG tablet Take 1 tablet (10 mg total) by mouth at bedtime. 30 tablet 11   ONE TOUCH ULTRA TEST test strip      ONETOUCH DELICA LANCETS FINE MISC      No current facility-administered medications for this visit.   Known Allergies:  No Known Allergies Past Surgical History: Past Surgical History:  Procedure Laterality Date   COLONOSCOPY W/ POLYPECTOMY     COLONOSCOPY WITH PROPOFOL N/A 07/18/2015   Procedure: COLONOSCOPY WITH PROPOFOL;  Surgeon: Charolett Bumpers, MD;  Location: [facility];  Service: Endoscopy;  Laterality: N/A;   ENDARTERECTOMY Left 08/30/2017   Procedure: ENDARTERECTOMY CAROTID LEFT;  Surgeon: Nada Libman, MD;  Location: [facility] OR;  Service: Vascular;  Laterality: Left;   TONSILLECTOMY     WISDOM TOOTH EXTRACTION     Family History: Family History  Problem Relation Age of Onset   Allergic rhinitis Neg Hx    Angioedema Neg Hx    Asthma Neg Hx    Eczema Neg Hx    Urticaria Neg Hx    Social History: Jose Welch lives trailer, heat pump, dog with access to bedroom.  Bed is to be off the floor.  Dust mite precautions on bed but not pillows.  Active smoker.  Not exposed to fumes, chemicals or dust.  HEPA filter in the home [place] an [place].   ROS:  All other systems negative except as noted per HPI.  Objective:  Blood pressure 114/60, pulse 98, temperature 98.6 F (37 C), temperature source Temporal, resp. rate 12, height 5' 4.96" (1.65 m), weight 110 lb 6.4 oz (50.1 kg), SpO2 91%. Body mass index is 18.39 kg/m. Physical Exam:  General Appearance:  Alert, cooperative, no distress, appears stated age, thin appearing   Head:  Normocephalic, without obvious abnormality, atraumatic  Eyes:  Conjunctiva clear, EOM's  intact  Ears EACs normal bilaterally  Nose: Nares normal,  pale mucosa, no visible anterior polyps, and septum midline  Throat: Lips, tongue normal; teeth and gums normal, normal posterior oropharynx  Neck: Supple, symmetrical  Lungs:   clear to auscultation bilaterally, Respirations unlabored, no coughing  Heart:  regular rate and rhythm and no murmur, Appears well perfused  Extremities: No edema  Skin: Skin color, texture, turgor normal and no rashes or lesions on visualized portions of skin  Neurologic: No gross deficits   Diagnostics: Spirometry:  Tracings reviewed. His effort: Good reproducible efforts. FVC: 2.92 L (pre), 3.07 L  (post) FEV1: 1.23 L, 46% predicted (pre), 1.26 L, 47% predicted (post) FEV1/FVC ratio: 42 (pre), 41 (post) Interpretation: Spirometry consistent with severe obstructive disease.  After 4 puffs of albuterol there is no significant postbronchodilator response Please see scanned spirometry results for details.   Labs:  Lab Orders  No laboratory test(s) ordered today     Assessment and Plan  Assessment and Plan    Chronic Obstructive Pulmonary Disease (COPD) Stable since hospitalization in 2023. Currently on Breztri and supplemental oxygen. History of inconsistent medication use. Last exacerbation required hospitalization and was initially suspected to be pneumonia. - Breathing tests today showed: inflammation in young lungs that was not reversible  - Will recheck eosinophil count to evaluate for possible dupixent use  - Continue Breztri 2 puffs twice daily  - Continue albuterol 2 puffs every 4 hours as needed  - Continue use of oxygen as needed and directed by pulmonary   Chronic rhinitis  Reports of runny nose, possibly seasonal. Previous use of Flonase and Claritin with some relief. Blood work showed low positive results for dust mite, grass pollen, tree pollen, and weed pollen allergies. Cannot skin test due to severity of COPD  - Continue  flonase 1 spray per nostril twice daily  - Continue claritin 10mg  daily as needed  - Continue Montelukast 10mg  daily  - Start Astelin 1 spray per nostril twice daily  - Start atovent 1 spray per nostril up to 4 times a day as needed for drainage  - Start avoidance measures below   Gastroesophageal Reflux Disease (GERD) Controlled with Pepcid -Continue current management with Peocid 20mg  daily   Tobacco Use Current smoker, 1 PPD  -Encourage smoking cessation as it can contribute to nasal congestion and exacerbate COPD.   Follow up: we will call you with lab results and go from there    This note in its entirety was forwarded to the Provider who requested this consultation.  Other: reviewed spirometry technique and reviewed inhaler technique  Thank you for your kind referral. I appreciate the opportunity to take part in Jose Welch's care. Please do not hesitate to contact me with questions.  Sincerely,  Thank you so much for letting me partake in your care today.  Don't hesitate to reach out if you have any additional concerns!  Ferol Luz, MD  [facility] [place], [place]

## 2023-09-28 LAB — CBC WITH DIFF/PLATELET
Basophils Absolute: 0 10*3/uL (ref 0.0–0.2)
Basos: 1 %
EOS (ABSOLUTE): 0 10*3/uL (ref 0.0–0.4)
Eos: 0 %
Hematocrit: 38.4 % (ref 37.5–51.0)
Hemoglobin: 11.6 g/dL — ABNORMAL LOW (ref 13.0–17.7)
Immature Grans (Abs): 0.1 10*3/uL (ref 0.0–0.1)
Immature Granulocytes: 1 %
Lymphocytes Absolute: 1.3 10*3/uL (ref 0.7–3.1)
Lymphs: 17 %
MCH: 24.9 pg — ABNORMAL LOW (ref 26.6–33.0)
MCHC: 30.2 g/dL — ABNORMAL LOW (ref 31.5–35.7)
MCV: 83 fL (ref 79–97)
Monocytes Absolute: 0.6 10*3/uL (ref 0.1–0.9)
Monocytes: 7 %
Neutrophils Absolute: 5.9 10*3/uL (ref 1.4–7.0)
Neutrophils: 74 %
Platelets: 268 10*3/uL (ref 150–450)
RBC: 4.65 x10E6/uL (ref 4.14–5.80)
RDW: 14.4 % (ref 11.6–15.4)
WBC: 7.9 10*3/uL (ref 3.4–10.8)

## 2023-10-07 DIAGNOSIS — H47323 Drusen of optic disc, bilateral: Secondary | ICD-10-CM | POA: Diagnosis not present

## 2023-10-07 DIAGNOSIS — E119 Type 2 diabetes mellitus without complications: Secondary | ICD-10-CM | POA: Diagnosis not present

## 2023-10-07 DIAGNOSIS — H25813 Combined forms of age-related cataract, bilateral: Secondary | ICD-10-CM | POA: Diagnosis not present

## 2023-10-07 NOTE — Progress Notes (Signed)
Patient is not a candidate for Dupixent therapy based on his labs.  He should continue take all his inhalers as prescribed and follow-up with his pulmonary doctor.

## 2023-10-08 DIAGNOSIS — R195 Other fecal abnormalities: Secondary | ICD-10-CM | POA: Diagnosis not present

## 2023-10-14 ENCOUNTER — Telehealth: Payer: Self-pay | Admitting: Nurse Practitioner

## 2023-10-14 NOTE — Telephone Encounter (Signed)
 Patient states needs to be recertified for oxygen . Patient uses [employer]. Patient phone number is [phone].

## 2023-10-14 NOTE — Telephone Encounter (Signed)
PT ret our call. Please try again. 

## 2023-10-14 NOTE — Telephone Encounter (Signed)
 Atc x1. Lmtcb

## 2023-10-16 DIAGNOSIS — R636 Underweight: Secondary | ICD-10-CM | POA: Diagnosis not present

## 2023-10-16 DIAGNOSIS — E785 Hyperlipidemia, unspecified: Secondary | ICD-10-CM | POA: Diagnosis not present

## 2023-10-16 DIAGNOSIS — Z9981 Dependence on supplemental oxygen: Secondary | ICD-10-CM | POA: Diagnosis not present

## 2023-10-16 DIAGNOSIS — E1169 Type 2 diabetes mellitus with other specified complication: Secondary | ICD-10-CM | POA: Diagnosis not present

## 2023-10-16 DIAGNOSIS — I5030 Unspecified diastolic (congestive) heart failure: Secondary | ICD-10-CM | POA: Diagnosis not present

## 2023-10-16 DIAGNOSIS — Z681 Body mass index (BMI) 19 or less, adult: Secondary | ICD-10-CM | POA: Diagnosis not present

## 2023-10-16 DIAGNOSIS — Z008 Encounter for other general examination: Secondary | ICD-10-CM | POA: Diagnosis not present

## 2023-10-16 NOTE — Telephone Encounter (Signed)
Lm for patient. Pt needs appt, as he was not walked and qualified during last OV.

## 2023-10-16 NOTE — Telephone Encounter (Signed)
Patient is returning phone call. Patient phone number is [phone]. Patient scheduled 01/19/2022 with Rhunette Croft NP.

## 2023-10-17 DIAGNOSIS — J9601 Acute respiratory failure with hypoxia: Secondary | ICD-10-CM | POA: Diagnosis not present

## 2023-10-17 DIAGNOSIS — J449 Chronic obstructive pulmonary disease, unspecified: Secondary | ICD-10-CM | POA: Diagnosis not present

## 2023-10-17 NOTE — Telephone Encounter (Signed)
Lm for patient.

## 2023-10-17 NOTE — Telephone Encounter (Signed)
Jose Welch- PT states he is on Short Term Disability needs a letter about how he is doing and his progress. Please [phone]

## 2023-10-18 ENCOUNTER — Telehealth: Payer: Self-pay | Admitting: Nurse Practitioner

## 2023-10-18 NOTE — Telephone Encounter (Signed)
LOV 09/18/2023 faxed to [facility] for short term disability

## 2023-10-18 NOTE — Telephone Encounter (Signed)
Patient called back, LOV 09/18/2023 faxed to [facility] for short term disability. Patient has an appointment in march to be re qualified for O2.

## 2023-10-18 NOTE — Telephone Encounter (Signed)
Received a disability form from [employer] on 2/11 - I've completed as much as I can and have given the form to Rhunette Croft, NP to finish filling in the medical information.

## 2023-10-21 NOTE — Telephone Encounter (Signed)
$  29 processing fee added to the chart

## 2023-10-21 NOTE — Telephone Encounter (Signed)
 Noted

## 2023-10-29 ENCOUNTER — Other Ambulatory Visit: Payer: Self-pay | Admitting: *Deleted

## 2023-10-29 ENCOUNTER — Telehealth: Payer: Self-pay | Admitting: *Deleted

## 2023-10-29 DIAGNOSIS — F17210 Nicotine dependence, cigarettes, uncomplicated: Secondary | ICD-10-CM

## 2023-10-29 DIAGNOSIS — Z122 Encounter for screening for malignant neoplasm of respiratory organs: Secondary | ICD-10-CM

## 2023-10-29 DIAGNOSIS — Z87891 Personal history of nicotine dependence: Secondary | ICD-10-CM

## 2023-10-29 NOTE — Telephone Encounter (Signed)
 Lung Cancer Screening Narrative/Criteria Questionnaire (Cigarette Smokers Only- No Cigars/Pipes/vapes)   Jose Welch   SDMV:11/19/23 1:30- Kristen                                           07/20/1956              LDCT: 11/20/23 1:00 - GI    68 y.o.   Phone: [phone]  Lung Screening Narrative (confirm age 54-77 yrs Medicare / 50-80 yrs Private pay insurance)   Insurance information:[employer]   Referring Provider:Mannam   This screening involves an initial phone call with a team member from our program. It is called a shared decision making visit. The initial meeting is required by insurance and Medicare to make sure you understand the program. This appointment takes about 15-20 minutes to complete. The CT scan will completed at a separate date/time. This scan takes about 5-10 minutes to complete and you may eat and drink before and after the scan.  Criteria questions for Lung Cancer Screening:   Are you a current or former smoker? Current Age began smoking: 78   If you are a former smoker, what year did you quit smoking? (within 15 yrs)   To calculate your smoking history, I need an accurate estimate of how many packs of cigarettes you smoked per day and for how many years. (Not just the number of PPD you are now smoking)   Years smoking 52 x Packs per day 1.5 = Pack years 75   (at least 20 pack yrs)   (Make sure they understand that we need to know how much they have smoked in the past, not just the number of PPD they are smoking now)  Do you have a personal history of cancer?  No    Do you have a family history of cancer? No  Are you coughing up blood?  No  Have you had unexplained weight loss of 15 lbs or more in the last 6 months? No  It looks like you meet all criteria.     Additional information: N/A

## 2023-10-29 NOTE — Telephone Encounter (Addendum)
 Short term disability form completed by Rhunette Croft, NP and faxed to [facility] - fax# [phone].

## 2023-10-31 ENCOUNTER — Ambulatory Visit: Payer: No Typology Code available for payment source | Attending: Nurse Practitioner | Admitting: Nurse Practitioner

## 2023-10-31 ENCOUNTER — Encounter: Payer: Self-pay | Admitting: Nurse Practitioner

## 2023-10-31 VITALS — BP 106/60 | HR 92 | Ht 67.0 in | Wt 112.0 lb

## 2023-10-31 DIAGNOSIS — Z720 Tobacco use: Secondary | ICD-10-CM | POA: Diagnosis not present

## 2023-10-31 DIAGNOSIS — R072 Precordial pain: Secondary | ICD-10-CM

## 2023-10-31 DIAGNOSIS — E1169 Type 2 diabetes mellitus with other specified complication: Secondary | ICD-10-CM

## 2023-10-31 DIAGNOSIS — J449 Chronic obstructive pulmonary disease, unspecified: Secondary | ICD-10-CM

## 2023-10-31 DIAGNOSIS — I5032 Chronic diastolic (congestive) heart failure: Secondary | ICD-10-CM

## 2023-10-31 DIAGNOSIS — E785 Hyperlipidemia, unspecified: Secondary | ICD-10-CM

## 2023-10-31 DIAGNOSIS — I6523 Occlusion and stenosis of bilateral carotid arteries: Secondary | ICD-10-CM | POA: Diagnosis not present

## 2023-10-31 MED ORDER — ASPIRIN 81 MG PO TBEC: 81.0000 mg | DELAYED_RELEASE_TABLET | Freq: Every day | ORAL | Status: AC

## 2023-10-31 MED ORDER — METOPROLOL TARTRATE 100 MG PO TABS: ORAL_TABLET | ORAL | 0 refills | Status: AC
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
## 2023-10-31 NOTE — Patient Instructions (Signed)
 Medication Instructions:  Your physician has recommended you make the following change in your medication:   -Start aspirin (EC) 81mg  once daily.  *If you need a refill on your cardiac medications before your next appointment, please call your pharmacy*   Lab Work: Your physician recommends that you have labs drawn today: BMET  If you have labs (blood work) drawn today and your tests are completely normal, you will receive your results only by: MyChart Message (if you have MyChart) OR A paper copy in the mail If you have any lab test that is abnormal or we need to change your treatment, we will call you to review the results.   Testing/Procedures: Your provider has requested that you have a carotid duplex. This test is an ultrasound of the carotid arteries in your neck. It looks at blood flow through these arteries that supply the brain with blood.   Allow one hour for this exam.  There are no restrictions or special instructions.  This will take place at [address] [facility]) [address], [place] [address]  Please note: We ask at that you not bring children with you during ultrasound (echo/ vascular) testing. Due to room size and safety concerns, children are not allowed in the ultrasound rooms during exams. Our front office staff cannot provide observation of children in our lobby area while testing is being conducted. An adult accompanying a patient to their appointment will only be allowed in the ultrasound room at the discretion of the ultrasound technician under special circumstances. We apologize for any inconvenience.    Follow-Up: At [facility], you and your health needs are our priority.  As part of our continuing mission to provide you with exceptional heart care, we have created designated Provider Care Teams.  These Care Teams include your primary Cardiologist (physician) and Advanced Practice Providers (APPs -  Physician Assistants and Nurse  Practitioners) who all work together to provide you with the care you need, when you need it.  We recommend signing up for the patient portal called "MyChart".  Sign up information is provided on this After Visit Summary.  MyChart is used to connect with patients for Virtual Visits (Telemedicine).  Patients are able to view lab/test results, encounter notes, upcoming appointments, etc.  Non-urgent messages can be sent to your provider as well.   To learn more about what you can do with MyChart, go to [email or web address].    Your next appointment:   4-6 week(s)  Provider:   Debbe Odea, MD, Lorine Bears, MD, Yvonne Kendall, MD, or Nicolasa Ducking, NP    Other Instructions   Your cardiac CT will be scheduled at the below location:    [facility] [address] [place], [place] [address] [phone]    If scheduled at [facility], please arrive 15 mins early for check-in and test prep.  There is spacious parking and easy access to the radiology department from the [facility] V[facility] entrance. Please enter here and check-in with the desk attendant.    Please follow these instructions carefully (unless otherwise directed):  An IV will be required for this test and Nitroglycerin will be given.  Hold all erectile dysfunction medications at least 3 days (72 hrs) prior to test. (Ie viagra, cialis, sildenafil, tadalafil, etc)   On the Night Before the Test: Be sure to Drink plenty of water. Do not consume any caffeinated/decaffeinated beverages or chocolate 12 hours prior to your test.  Do not take any antihistamines 12 hours prior to your test.  On the Day of the Test: Drink plenty of water until 1 hour prior to the test. Do not eat any food 1 hour prior to test. You may take your regular medications prior to the test.  Take metoprolol (Lopressor) 100mg  two hours prior to test. If you take  Furosemide/Hydrochlorothiazide/Spironolactone/Chlorthalidone, please HOLD on the morning of the test. Patients who wear a continuous glucose monitor MUST remove the device prior to scanning.      After the Test: Drink plenty of water. After receiving IV contrast, you may experience a mild flushed feeling. This is normal. On occasion, you may experience a mild rash up to 24 hours after the test. This is not dangerous. If this occurs, you can take Benadryl 25 mg, Zyrtec, Claritin, or Allegra and increase your fluid intake. (Patients taking Tikosyn should avoid Benadryl, and may take Zyrtec, Claritin, or Allegra) If you experience trouble breathing, this can be serious. If it is severe call 911 IMMEDIATELY. If it is mild, please call our office.  We will call to schedule your test 2-4 weeks out understanding that some insurance companies will need an authorization prior to the service being performed.   For more information and frequently asked questions, please visit our website : [email or web address]  For non-scheduling related questions, please contact the cardiac imaging nurse navigator should you have any questions/concerns: Cardiac Imaging Nurse Navigators Direct Office Dial: [phone]   For scheduling needs, including cancellations and rescheduling, please call [place], [phone].

## 2023-11-01 LAB — BASIC METABOLIC PANEL
BUN/Creatinine Ratio: 18 (ref 10–24)
BUN: 14 mg/dL (ref 8–27)
CO2: 21 mmol/L (ref 20–29)
Calcium: 10.1 mg/dL (ref 8.6–10.2)
Chloride: 98 mmol/L (ref 96–106)
Creatinine, Ser: 0.79 mg/dL (ref 0.76–1.27)
Glucose: 104 mg/dL — ABNORMAL HIGH (ref 70–99)
Potassium: 5.1 mmol/L (ref 3.5–5.2)
Sodium: 136 mmol/L (ref 134–144)
eGFR: 97 mL/min/{1.73_m2} (ref >59)

## 2023-11-08 ENCOUNTER — Ambulatory Visit: Payer: No Typology Code available for payment source | Admitting: Cardiovascular Disease

## 2023-11-11 DIAGNOSIS — J449 Chronic obstructive pulmonary disease, unspecified: Secondary | ICD-10-CM | POA: Diagnosis not present

## 2023-11-11 DIAGNOSIS — I5032 Chronic diastolic (congestive) heart failure: Secondary | ICD-10-CM | POA: Diagnosis not present

## 2023-11-11 DIAGNOSIS — R399 Unspecified symptoms and signs involving the genitourinary system: Secondary | ICD-10-CM | POA: Diagnosis not present

## 2023-11-11 DIAGNOSIS — R269 Unspecified abnormalities of gait and mobility: Secondary | ICD-10-CM | POA: Diagnosis not present

## 2023-11-11 DIAGNOSIS — J9611 Chronic respiratory failure with hypoxia: Secondary | ICD-10-CM | POA: Diagnosis not present

## 2023-11-11 DIAGNOSIS — E11319 Type 2 diabetes mellitus with unspecified diabetic retinopathy without macular edema: Secondary | ICD-10-CM | POA: Diagnosis not present

## 2023-11-14 ENCOUNTER — Telehealth (HOSPITAL_COMMUNITY): Payer: Self-pay | Admitting: Emergency Medicine

## 2023-11-14 ENCOUNTER — Encounter (HOSPITAL_COMMUNITY): Payer: Self-pay

## 2023-11-14 DIAGNOSIS — J449 Chronic obstructive pulmonary disease, unspecified: Secondary | ICD-10-CM | POA: Diagnosis not present

## 2023-11-14 DIAGNOSIS — R269 Unspecified abnormalities of gait and mobility: Secondary | ICD-10-CM | POA: Diagnosis not present

## 2023-11-14 DIAGNOSIS — J9601 Acute respiratory failure with hypoxia: Secondary | ICD-10-CM | POA: Diagnosis not present

## 2023-11-14 NOTE — Telephone Encounter (Signed)
 Attempted to call patient regarding upcoming cardiac CT appointment. Left message on voicemail with name and callback number Rockwell Alexandria RN Navigator Cardiac Imaging [facility] Services [phone] Office [phone] Cell

## 2023-11-15 ENCOUNTER — Telehealth (HOSPITAL_COMMUNITY): Payer: Self-pay | Admitting: *Deleted

## 2023-11-15 NOTE — Telephone Encounter (Signed)
 Reaching out to patient to offer assistance regarding upcoming cardiac imaging study; pt verbalizes understanding of appt date/time, parking situation and where to check in, pre-test NPO status and medications ordered, and verified current allergies; name and call back number provided for further questions should they arise  Larey Brick RN Navigator Cardiac Imaging [facility] [phone] office [phone] cell  Patient to take 100mg  metoprolol tartrate two hours prior to his cardiac CT scan.

## 2023-11-18 ENCOUNTER — Other Ambulatory Visit (HOSPITAL_COMMUNITY): Payer: Self-pay | Admitting: *Deleted

## 2023-11-18 ENCOUNTER — Ambulatory Visit
Admission: RE | Admit: 2023-11-18 | Discharge: 2023-11-18 | Disposition: A | Discharge status: Home / Self Care | Payer: No Typology Code available for payment source | Source: Ambulatory Visit | Attending: Nurse Practitioner | Admitting: Nurse Practitioner

## 2023-11-18 DIAGNOSIS — R072 Precordial pain: Secondary | ICD-10-CM

## 2023-11-18 MED ORDER — IOHEXOL 350 MG/ML SOLN: 80.0000 mL | Freq: Once | INTRAVENOUS | Status: DC | PRN

## 2023-11-18 MED ORDER — DILTIAZEM HCL 25 MG/5ML IV SOLN: 10.0000 mg | INTRAVENOUS | Status: DC | PRN

## 2023-11-18 MED ORDER — IVABRADINE HCL 7.5 MG PO TABS: ORAL_TABLET | ORAL | 0 refills | Status: AC

## 2023-11-18 MED ORDER — NITROGLYCERIN 0.4 MG SL SUBL: 0.8000 mg | SUBLINGUAL_TABLET | Freq: Once | SUBLINGUAL | Status: DC

## 2023-11-18 MED ORDER — METOPROLOL TARTRATE 5 MG/5ML IV SOLN: 10.0000 mg | Freq: Once | INTRAVENOUS | Status: DC | PRN

## 2023-11-18 MED ORDER — METOPROLOL TARTRATE 100 MG PO TABS: ORAL_TABLET | ORAL | 0 refills | Status: AC

## 2023-11-18 NOTE — Progress Notes (Signed)
 PT came in with a HR of 76 and BP of 104/69. With a breath hold HR was 73. Dr. Azucena Cecil notified. Pt did take metoprolol pre-medication prior to coming in. As per Dr. Azucena Cecil, pt to be rescheduled with 100mg  of Metoprolol and 15mg  of Ivabradine. Group message sent to Dr. Azucena Cecil and cardiac navigators. Pt left with sister.

## 2023-11-19 ENCOUNTER — Ambulatory Visit (INDEPENDENT_AMBULATORY_CARE_PROVIDER_SITE_OTHER): Payer: No Typology Code available for payment source | Admitting: Acute Care

## 2023-11-19 DIAGNOSIS — Z720 Tobacco use: Secondary | ICD-10-CM | POA: Diagnosis not present

## 2023-11-19 DIAGNOSIS — F17210 Nicotine dependence, cigarettes, uncomplicated: Secondary | ICD-10-CM | POA: Diagnosis not present

## 2023-11-19 NOTE — Progress Notes (Signed)
 Provider Attestation I agree with the documentation of the Shared Decision Making visit,  smoking cessation counseling if appropriate, and verification or eligibility for lung cancer screening as documented by the RN Nurse Navigator.   Raejean Bullock, MSN, AGACNP-BC [facility] See Amion for personal pager PCCM on call pager [phone]    Virtual Visit via Video Note  I connected with Jose Welch on 11/19/23 at  1:30 PM EDT by a video enabled telemedicine application and verified that I am speaking with the correct person using two identifiers.  Location: Patient: in home Provider: [address] [place], [place], [address]     Shared Decision Making Visit Lung Cancer Screening Program [phone])   Eligibility: Age 68 y.o. Pack Years Smoking History Calculation 75 (# packs/per year x # years smoked) Recent History of coughing up blood  no Unexplained weight loss? no ( >Than 15 pounds within the last 6 months ) Prior History Lung / other cancer no (Diagnosis within the last 5 years already requiring surveillance chest CT Scans). Smoking Status Current Smoker Former Smokers: Years since quit:  NA  Quit Date: NA  Visit Components: Discussion included one or more decision making aids. yes Discussion included risk/benefits of screening. yes Discussion included potential follow up diagnostic testing for abnormal scans. yes Discussion included meaning and risk of over diagnosis. yes Discussion included meaning and risk of False Positives. yes Discussion included meaning of total radiation exposure. yes  Counseling Included: Importance of adherence to annual lung cancer LDCT screening. yes Impact of comorbidities on ability to participate in the program. yes Ability and willingness to under diagnostic treatment. yes  Smoking Cessation Counseling: Current Smokers:  Discussed importance of smoking cessation. yes Information about tobacco  cessation classes and interventions provided to patient. yes Patient provided with "ticket" for LDCT Scan. yes Symptomatic Patient. no  Counseling NA Diagnosis Code: Tobacco Use Z72.0 Asymptomatic Patient yes  Counseling (Intermediate counseling: > three minutes counseling) [id number] Former Smokers:  Discussed the importance of maintaining cigarette abstinence. yes Diagnosis Code: Personal History of Nicotine  Dependence. O13.086 Information about tobacco cessation classes and interventions provided to patient. Yes Patient provided with "ticket" for LDCT Scan. yes Written Order for Lung Cancer Screening with LDCT placed in Epic. Yes (CT Chest Lung Cancer Screening Low Dose W/O CM) [id number] Z12.2-Screening of respiratory organs Z87.891-Personal history of nicotine  dependence   Jose Gaskins, RN 11/19/23

## 2023-11-19 NOTE — Patient Instructions (Signed)

## 2023-11-26 ENCOUNTER — Ambulatory Visit: Payer: No Typology Code available for payment source

## 2023-11-28 ENCOUNTER — Telehealth: Payer: Self-pay

## 2023-11-28 ENCOUNTER — Ambulatory Visit: Payer: No Typology Code available for payment source | Admitting: Nurse Practitioner

## 2023-11-28 NOTE — Telephone Encounter (Signed)
 Contacted [employer] to get CMN refaxed. Was told a new order was needed for POC with pulse dose setting. Spoke with Florentina Addison who states patient does not need POC. Left voicemail for patient to call back to confirm what is needed and if he still has his POC.

## 2023-11-28 NOTE — Telephone Encounter (Signed)
 Called [employer] on behalf of patient, and they stated that they are needing  a CMN order for his POC.

## 2023-11-29 ENCOUNTER — Ambulatory Visit
Admission: RE | Admit: 2023-11-29 | Discharge: 2023-11-29 | Disposition: A | Discharge status: Home / Self Care | Source: Ambulatory Visit | Attending: Acute Care | Admitting: Acute Care

## 2023-11-29 DIAGNOSIS — F17210 Nicotine dependence, cigarettes, uncomplicated: Secondary | ICD-10-CM

## 2023-11-29 DIAGNOSIS — R911 Solitary pulmonary nodule: Secondary | ICD-10-CM | POA: Diagnosis not present

## 2023-11-29 DIAGNOSIS — Z122 Encounter for screening for malignant neoplasm of respiratory organs: Secondary | ICD-10-CM

## 2023-11-29 DIAGNOSIS — Z87891 Personal history of nicotine dependence: Secondary | ICD-10-CM

## 2023-12-11 ENCOUNTER — Ambulatory Visit: Payer: No Typology Code available for payment source | Admitting: Cardiology

## 2023-12-15 DIAGNOSIS — J9601 Acute respiratory failure with hypoxia: Secondary | ICD-10-CM | POA: Diagnosis not present

## 2023-12-15 DIAGNOSIS — J449 Chronic obstructive pulmonary disease, unspecified: Secondary | ICD-10-CM | POA: Diagnosis not present

## 2023-12-16 DIAGNOSIS — H47323 Drusen of optic disc, bilateral: Secondary | ICD-10-CM | POA: Diagnosis not present

## 2023-12-16 DIAGNOSIS — H52213 Irregular astigmatism, bilateral: Secondary | ICD-10-CM | POA: Diagnosis not present

## 2023-12-16 DIAGNOSIS — E119 Type 2 diabetes mellitus without complications: Secondary | ICD-10-CM | POA: Diagnosis not present

## 2023-12-16 DIAGNOSIS — H25813 Combined forms of age-related cataract, bilateral: Secondary | ICD-10-CM | POA: Diagnosis not present

## 2023-12-16 DIAGNOSIS — H524 Presbyopia: Secondary | ICD-10-CM | POA: Diagnosis not present

## 2024-01-01 DIAGNOSIS — I779 Disorder of arteries and arterioles, unspecified: Secondary | ICD-10-CM | POA: Diagnosis not present

## 2024-01-01 DIAGNOSIS — J449 Chronic obstructive pulmonary disease, unspecified: Secondary | ICD-10-CM | POA: Diagnosis not present

## 2024-01-01 DIAGNOSIS — I5032 Chronic diastolic (congestive) heart failure: Secondary | ICD-10-CM | POA: Diagnosis not present

## 2024-01-01 DIAGNOSIS — F17210 Nicotine dependence, cigarettes, uncomplicated: Secondary | ICD-10-CM | POA: Diagnosis not present

## 2024-01-01 DIAGNOSIS — E46 Unspecified protein-calorie malnutrition: Secondary | ICD-10-CM | POA: Diagnosis not present

## 2024-01-01 DIAGNOSIS — E11319 Type 2 diabetes mellitus with unspecified diabetic retinopathy without macular edema: Secondary | ICD-10-CM | POA: Diagnosis not present

## 2024-01-01 DIAGNOSIS — I700 Atherosclerosis of aorta: Secondary | ICD-10-CM | POA: Diagnosis not present

## 2024-01-01 DIAGNOSIS — J9611 Chronic respiratory failure with hypoxia: Secondary | ICD-10-CM | POA: Diagnosis not present

## 2024-01-01 DIAGNOSIS — F419 Anxiety disorder, unspecified: Secondary | ICD-10-CM | POA: Diagnosis not present

## 2024-01-01 DIAGNOSIS — E113291 Type 2 diabetes mellitus with mild nonproliferative diabetic retinopathy without macular edema, right eye: Secondary | ICD-10-CM | POA: Diagnosis not present

## 2024-01-01 DIAGNOSIS — R21 Rash and other nonspecific skin eruption: Secondary | ICD-10-CM | POA: Diagnosis not present

## 2024-01-03 ENCOUNTER — Telehealth: Payer: Self-pay | Admitting: Acute Care

## 2024-01-03 NOTE — Telephone Encounter (Signed)
 Call report received:  IMPRESSION: 1. Lung-RADS 4A, suspicious. Follow up low-dose chest CT without contrast in 3 months (please use the following order, "CT CHEST LCS NODULE FOLLOW-UP W/O CM") is recommended. New peripheral left upper lobe 7.3 mm solid pulmonary nodule witn a somewhat bandlike configuration, favor infectious or inflammatory etiology. 2. Two solid right lower lobe pulmonary nodules, largest 0.8 cm, both stable since 11/14/2021 CT. 3. Three-vessel coronary atherosclerosis. 4. Diffuse osteopenia. 5. Aortic Atherosclerosis (ICD10-I70.0) and Emphysema (ICD10-J43.9).

## 2024-01-06 ENCOUNTER — Telehealth: Payer: Self-pay | Admitting: Acute Care

## 2024-01-06 ENCOUNTER — Other Ambulatory Visit: Payer: Self-pay

## 2024-01-06 DIAGNOSIS — Z87891 Personal history of nicotine dependence: Secondary | ICD-10-CM

## 2024-01-06 DIAGNOSIS — Z122 Encounter for screening for malignant neoplasm of respiratory organs: Secondary | ICD-10-CM

## 2024-01-06 DIAGNOSIS — R911 Solitary pulmonary nodule: Secondary | ICD-10-CM

## 2024-01-06 NOTE — Telephone Encounter (Signed)
 See provider notes from 01/06/2024

## 2024-01-06 NOTE — Telephone Encounter (Signed)
 Results and plan sent to PCP. 3 Month f/u order placed for 02/29/2024. Reminder set.

## 2024-01-06 NOTE — Telephone Encounter (Signed)
 I have called the patient with results of his low-dose screening CT.  His scan was read as a lung RADS 4A, . There is a new 7.3 mm nodule in the left upper lobe , radiology favor infectious or inflammatory etiology. Pt. States he does not remember if he was sick at the time the scan was done. He is in agreement with a 3 month follow up to ensure the nodule is stable or resolved. This will be due end of June 2025. This scan was done 11/29/2023 and read 01/03/2024.   [facility] [employer], please fax results to PCP and let them know plan is for a 3 month follow up due end of June 2025. Thanks so much

## 2024-01-09 ENCOUNTER — Encounter: Payer: Self-pay | Admitting: Nurse Practitioner

## 2024-01-09 ENCOUNTER — Ambulatory Visit: Admitting: Nurse Practitioner

## 2024-01-09 VITALS — BP 142/94 | HR 103 | Ht 67.0 in | Wt 115.8 lb

## 2024-01-09 DIAGNOSIS — R600 Localized edema: Secondary | ICD-10-CM

## 2024-01-09 DIAGNOSIS — I5189 Other ill-defined heart diseases: Secondary | ICD-10-CM | POA: Diagnosis not present

## 2024-01-09 DIAGNOSIS — F17210 Nicotine dependence, cigarettes, uncomplicated: Secondary | ICD-10-CM

## 2024-01-09 DIAGNOSIS — J9611 Chronic respiratory failure with hypoxia: Secondary | ICD-10-CM

## 2024-01-09 DIAGNOSIS — J449 Chronic obstructive pulmonary disease, unspecified: Secondary | ICD-10-CM | POA: Diagnosis not present

## 2024-01-09 DIAGNOSIS — J301 Allergic rhinitis due to pollen: Secondary | ICD-10-CM

## 2024-01-09 NOTE — Progress Notes (Signed)
 @Patient  ID: Jose Welch, male    DOB: 02-Feb-1956, 68 y.o.   MRN: [id number]  Chief Complaint  Patient presents with   Follow-up    Patient states he's doing well    Referring provider: Jearldine Mina, MD  HPI: 68 year old male, active smoker followed for COPD and chronic respiratory failure on supplemental O2. He is a patient of Dr. Isabel Many and last seen in office 09/16/2023 by [facility] NP. Past medical history significant for carotid stenosis, HLD, DM, anxiety, protein-calorie malnutrition.   TEST/EVENTS:  01/2022 IgE 897, Eos 100, A1AT 152/MM 04/03/2022 PFT: FVC 68, FEV1 44, ratio 50, TLC 113, DLCO 29.  Severe obstructive airway disease with reversibility.  Severe diffusing defect 10/30/2022 CT chest without contrast: Atherosclerosis.  Small mediastinal lymph nodes, within normal limits.  Biapical pleural-parenchymal scarring.  Severe centrilobular and paraseptal emphysema.  Mild scarring in the medial right lower lobe.  3 mm right lower lobe nodule no longer evident.  Scattered right lower lobe nodules measuring up to 6 mm, considered benign. 07/15/2023 positive RAST grasses, trees, dust, dog, cockroach, trees; IgE 1,612 08/13/2023 echo: EF 60-65%, GIDD. RV size and function nl. Aortic dilatation of ascending aorta, measuring 38 mm.  11/29/2023 LDCT chest: atherosclerosis. Emphysema. Two solid RLL nodules, largest 0.8 cm stable since 11/2021. Peripheral LUL 7.3 mm solid nodule, new since 10/2022. Lung RADS 4A, suspicious. Scattered subcm hypodense liver lesions, too small to characterize   10/09/2022: OV with Sueanne Emerald NP. Presents to qualify for POC. Current smoker, keeps a chronic cough which is more prominent in the morning. Maintained on Breztri. Uses albuterol  1-2 times a day. Been on O2 since May 2023. Uses oxygen  3-4 lpm as needed. Difficult to carry tanks. PFTs in August with severe obstruction and positive bronchodilator response. Advised to use mucinex  to help loosen congestion. Qualified for  POC >> dropped to 86% after walk 250 ft on room air; needed 3 lpm to maintain >88-90%. CT chest scheduled for 10/30/2022.   07/15/2023: OV with Camron Essman NP. The patient, with a history of severe COPD and chronic respiratory failure, presents with complaints of shortness of breath that worsens with activity. He reports needing to rest after taking care of his dog and experiencing some shortness of breath when walking to the exam room. He feels like his breathing is at his baseline. He is currently on a regimen of Breztri, two puffs twice a day, and uses albuterol  rescue inhaler, once or twice daily. He also uses oxygen  therapy, with a home unit set to 6 lpm and a portable concentrator set to 5 lpm, which only lasts an hour. He has a daily cough, mostly in AM. Minimal clear phlegm. No issues with wheezing or chest congestion.  No hemoptysis, weight loss, anorexia, night sweats, fevers.  The patient has attempted smoking cessation with Chantix  and nicotine  patches, but discontinued both due to ineffectiveness and skin irritation, respectively. He continues to smoke about a pack a day. He also reports frequent sneezing, which he manages with Claritin. He has occasional nasal congestion and drainage. No headaches, sore throat or ear pain.  The patient has noticed some swelling in his ankles, which has been ongoing for a few months. He has been advised by his primary care provider to keep his feet elevated. He denies any orthopnea, weight gain, PND, chest pain, palpitations  09/16/2023: OV with Dajuana Palen NP for follow up. After his last visit, we referred him to allergy due to positive RAST and elevated IgE.  He never scheduled this appointment. He has frequent sneezing, nasal congestion and drainage. He did start singulair , which has been helping some.  He also had an echocardiogram, which revealed GIDD and a dilated ascending aorta. He has not seen cardiology before. He does have swelling in his legs that he occasionally  takes lasix for. He denies orthopnea, chest pain, palpitations.  Regarding his breathing, he feels like it is stable. He gets short winded with most activities, which is baseline for him. Usually has to stop and rest after longer distances or more strenuous activities. He has a daily cough in the AM with clear sputum. No fevers, chills, hemoptysis, weight loss, anorexia. Using his albuterol  1-2 times a day. On Brztri.  He does not have his oxygen  on today. He does try to wear it when he goes out. More consistent at home with his concentrator. He tells me that [employer] said he needed a new order; this was placed at his last visit.   01/09/2024: Today - follow up Patient presents today for follow up. Since his last visit, he saw cardiology and allergy. He had cardiac CT ordered, which has not been completed yet. Allergy added on atrovent  nasal spray but no other significant changes made. Otherwise, he is doing about the same as his last visit. Feels stable overall. Gets short winded with most activities, which is baseline for him. Usually has to stop and rest after longer distances. Baseline daily cough. No fevers, chills, hemoptysis, anorexia, weight loss. Using abluterol once a day and Breztri twice daily. He is still using his POC on 4 lpm with most activity when out of the house. Wears his concentrator at home.  He had CT chest for lung cancer screening with lung RADS 4A.  He has repeat CT chest ordered at the end of June for follow-up.   No Known Allergies  Immunization History  Administered Date(s) Administered   Influenza Split 06/15/2014, 05/17/2015, 07/04/2017, 06/10/2020, 06/02/2021   Influenza, Seasonal, Injecte, Preservative Fre 05/22/2016   Influenza,inj,Quad PF,6+ Mos 05/22/2018, 05/27/2019   PFIZER(Purple Top)SARS-COV-2 Vaccination 11/26/2019, 06/21/2020   Pfizer Covid-19 Vaccine Bivalent Booster 68yrs & up 09/24/2021   Pneumococcal Polysaccharide-23 03/03/2014, 08/31/2017   Td 01/19/2006    Tdap 04/16/2016   Zoster, Live 04/05/2015    Past Medical History:  Diagnosis Date   Allergic rhinitis    Anxiety    Aortic atherosclerosis (HCC)    a. 10/2022 CT Chest: Ca2+ of Ao root/arch.   Arthritis    Right wrist   Carotid arterial disease (HCC)    a. 08/2017 s/p L CEA w/ bovine pericardial patch angioplasty; b. 08/2021 U/S: RICA 60-79%, LICA 40-59%.   Chronic heart failure with preserved ejection fraction (HFpEF) (HCC)    a. 08/2023 Echo: EF 60-65%, no rwma, GrI DD, nl RV size/fxn. Asc Ao 38mm.   COPD (chronic obstructive pulmonary disease) (HCC)    a. On supplemental home O2.   Coronary artery calcification seen on CT scan    a. 10/2022 CT Chest: Moderate three-vessel coronary atherosclerosis.   GERD (gastroesophageal reflux disease)    History of hiatal hernia    History of stress test    a. 08/2017 MV: EF 54%, no ischemia/infarct-->low risk.   Lung nodules    RBBB    Tobacco abuse    Type 2 diabetes mellitus without complication (HCC)     Tobacco History: Social History   Tobacco Use  Smoking Status Every Day   Current packs/day: 1.50  Average packs/day: 1.5 packs/day for 45.2 years (67.8 ttl pk-yrs)   Types: Cigarettes   Start date: 10/30/1978   Passive exposure: Past  Smokeless Tobacco Never   Ready to quit: Not Answered Counseling given: Not Answered   Outpatient Medications Prior to Visit  Medication Sig Dispense Refill   albuterol  (VENTOLIN  HFA) 108 (90 Base) MCG/ACT inhaler Inhale 1 puff into the lungs every 6 (six) hours as needed for shortness of breath.     ALPRAZolam (XANAX) 0.25 MG tablet 0.5 mg.     aspirin  EC 81 MG tablet Take 1 tablet (81 mg total) by mouth daily. Swallow whole.     atorvastatin  (LIPITOR) 10 MG tablet Take 10 mg by mouth daily.     azelastine  (ASTELIN ) 0.1 % nasal spray Place 2 sprays into both nostrils 2 (two) times daily. Use in each nostril as directed 30 mL 12   BREZTRI AEROSPHERE 160-9-4.8 MCG/ACT AERO Inhale 2 puffs  into the lungs in the morning and at bedtime.     diclofenac  Sodium (VOLTAREN ) 1 % GEL Apply 4 g topically 4 (four) times daily. (Patient taking differently: Apply 4 g topically daily as needed (for pain).) 100 g 0   famotidine  (PEPCID ) 20 MG tablet Take 20 mg by mouth daily as needed for heartburn.     fluticasone  (FLONASE) 50 MCG/ACT nasal spray Place 1 spray into both nostrils daily as needed for allergies.     ipratropium (ATROVENT ) 0.06 % nasal spray Place 2 sprays into both nostrils 4 (four) times daily. 15 mL 12   ivabradine  (CORLANOR) 7.5 MG TABS tablet Take tablets (15mg ) by mouth TWO hours prior to your cardiac CT scan. 2 tablet 0   meloxicam  (MOBIC ) 7.5 MG tablet Take 1 tablet (7.5 mg total) by mouth daily. 14 tablet 0   metFORMIN  (GLUCOPHAGE ) 500 MG tablet Take 500 mg by mouth 2 (two) times daily with a meal.     metoprolol  tartrate (LOPRESSOR ) 100 MG tablet Take 1 tablet (100mg ) TWO hours prior to CT scan 1 tablet 0   metoprolol  tartrate (LOPRESSOR ) 100 MG tablet Take tablet (100mg ) TWO hours prior to your cardiac CT scan. 1 tablet 0   montelukast  (SINGULAIR ) 10 MG tablet Take 1 tablet (10 mg total) by mouth at bedtime. 30 tablet 11   ONE TOUCH ULTRA TEST test strip      ONETOUCH DELICA LANCETS FINE MISC      OXYGEN  Inhale 5-6 L into the lungs daily.     No facility-administered medications prior to visit.     Review of Systems:   Constitutional: No weight loss or gain, night sweats, fevers, chills, fatigue, or lassitude. HEENT: No headaches, difficulty swallowing, tooth/dental problems, or sore throat. No itching, ear ache. +sneezing, nasal congestion, post nasal drip (baseline; improved) CV:  +swelling in lower extremities (baseline). No chest pain, orthopnea, PND, anasarca, dizziness, palpitations, syncope Resp: +shortness of breath with exertion; AM cough (baseline). No excess mucus or change in color of mucus. No hemoptysis. No wheezing.  No chest wall deformity GI:  No  heartburn, indigestion, abdominal pain, nausea, vomiting, diarrhea, change in bowel habits, loss of appetite, bloody stools.  GU: No dysuria, change in color of urine, urgency or frequency.  Skin: No rash, lesions, ulcerations MSK:  No joint pain or swelling.   Neuro: No dizziness or lightheadedness.  Psych: No depression or anxiety. Mood stable.     Physical Exam:  Pulse (!) 103   Ht 5\' 7"  (1.702 m)  Wt 115 lb 12.8 oz (52.5 kg)   SpO2 95%   BMI 18.14 kg/m   GEN: Pleasant, interactive, well-kempt, chronically-ill appearing; in no acute distress HEENT:  Normocephalic and atraumatic. PERRLA. Sclera white. Nasal turbinates pink, moist and patent bilaterally. No rhinorrhea present. Oropharynx pink and moist, without exudate or edema. No lesions, ulcerations, or postnasal drip.  NECK:  Supple w/ fair ROM. No JVD present. Normal carotid impulses w/o bruits. Thyroid symmetrical with no goiter or nodules palpated. No lymphadenopathy.   CV: RRR, no m/r/g. +1 BLE edema. Pulses intact, +2 bilaterally. No cyanosis, pallor or clubbing. PULMONARY:  Unlabored, regular breathing. Diminished b/l A&P w/o wheezes/rales/rhonchi. No accessory muscle use.  GI: BS present and normoactive. Soft, non-tender to palpation. No organomegaly or masses detected.  MSK: No erythema, warmth or tenderness. Cap refil <2 sec all extrem. No deformities or joint swelling noted. Muscle wasting Neuro: A/Ox3. No focal deficits noted.   Skin: Warm, no lesions or rashe Psych: Normal affect and behavior. Judgement and thought content appropriate.     Lab Results:  CBC    Component Value Date/Time   WBC 7.9 09/27/2023 1452   WBC 11.0 (H) 01/24/2022 1524   RBC 4.65 09/27/2023 1452   RBC 4.39 01/24/2022 1524   HGB 11.6 (L) 09/27/2023 1452   HCT 38.4 09/27/2023 1452   PLT 268 09/27/2023 1452   MCV 83 09/27/2023 1452   MCH 24.9 (L) 09/27/2023 1452   MCH 27.7 01/11/2022 0258   MCHC 30.2 (L) 09/27/2023 1452   MCHC 31.8  01/24/2022 1524   RDW 14.4 09/27/2023 1452   LYMPHSABS 1.3 09/27/2023 1452   MONOABS 0.6 01/24/2022 1524   EOSABS 0.0 09/27/2023 1452   BASOSABS 0.0 09/27/2023 1452    BMET    Component Value Date/Time   NA 136 10/31/2023 1122   K 5.1 10/31/2023 1122   CL 98 10/31/2023 1122   CO2 21 10/31/2023 1122   GLUCOSE 104 (H) 10/31/2023 1122   GLUCOSE 90 07/15/2023 1158   BUN 14 10/31/2023 1122   CREATININE 0.79 10/31/2023 1122   CALCIUM  10.1 10/31/2023 1122   GFRNONAA >60 01/11/2022 0258   GFRAA >60 08/31/2017 0316    BNP    Component Value Date/Time   BNP 26.8 01/10/2022 1654     Imaging:  No results found.  Administration History     None          Latest Ref Rng & Units 04/03/2022    8:48 AM  PFT Results  FVC-Pre L 2.79   FVC-Predicted Pre % 68   FVC-Post L 3.05   FVC-Predicted Post % 75   Pre FEV1/FVC % % 48   Post FEV1/FCV % % 50   FEV1-Pre L 1.34   FEV1-Predicted Pre % 44   FEV1-Post L 1.53   DLCO uncorrected ml/min/mmHg 7.00   DLCO UNC% % 29   DLCO corrected ml/min/mmHg 7.00   DLCO COR %Predicted % 29   DLVA Predicted % 32   TLC L 7.25   TLC % Predicted % 113   RV % Predicted % 175     No results found for: "NITRICOXIDE"      Assessment & Plan:      Chronic Obstructive Pulmonary Disease (COPD) COPD with high symptom burden. Continues to smoke heavily, which contributes to symptoms. Smoking cessation strongly advised. No recent exacerbations or hospitalizations. Not acutely exacerbating today. Added on Singulair  previously for allergy-mediated airway inflammation/trigger prevention - tolerating well. Continue triple therapy  regimen. Action plan in place - Continue Breztri 2 puffs bid - Continue PRN albuterol   - Continue Singulair  10 mg at bedtime  Allergic rhinitis Significantly elevated IgE and positive RAST. Continue current regimen. Follow up with allergy as scheduled  - Follow up with allergy as scheduled  - Continue daily  antihistamine - Continue nasal sprays  - Continue singulair  daily   Diastolic dysfunction  BLE edema on exam; stable. Echo with normal EF and GIDD. Recent BNP nl.  - Attend cardiac CT - Follow up with cardiology as scheduled  - Continue lasix as prescribed by PCP  Lung Cancer Screening Smoking history with CT scan in March 2025 with Lung RADS 4A. Repeat LCS CT chest ordered for follow up end of June 2025.  - Follow up with lung cancer screening program after next CT  Current smoker  Smoking ~1 pack/day. Previous Chantix  and nicotine  patches unsuccessful due to side effects and lack of efficacy.  - Discuss alternative smoking cessation strategies   Chronic respiratory failure with hypoxia Prior walk test unable to maintain on POC. Never switch to tanks despite order. Re-walked today; able to maintain on 4-5 lpm POC. Discussed importance of compliance with oxygen  use and risks of untreated hypoxia. Encouraged to utilize O2. Advised to monitor while on POC. Goal >88-90% - Continue supplemental oxygen  4-5 lpm  Follow-up - Follow up in 4-6 months with Dr. Waylan Haggard or Gina Lagos    Advised if symptoms do not improve or worsen, to please contact office for sooner follow up or seek emergency care.   I spent 35 minutes of dedicated to the care of this patient on the date of this encounter to include pre-visit review of records, face-to-face time with the patient discussing conditions above, post visit ordering of testing, clinical documentation with the electronic health record, making appropriate referrals as documented, and communicating necessary findings to members of the patients care team.  Roetta Clarke, NP 01/09/2024  Pt aware and understands NP's role.

## 2024-01-09 NOTE — Patient Instructions (Addendum)
 Continue Albuterol  inhaler 2 puffs or 3 mL neb every 6 hours as needed for shortness of breath or wheezing. Notify if symptoms persist despite rescue inhaler/neb use. Continue Breztri 2 puffs Twice daily. Brush tongue and rinse mouth afterwards Continue claritin daily  Continue supplemental oxygen  4-5 lpm. Need to monitor your oxygen  while on POC. You were able to keep your oxygen  levels up on 4-5 lpm POC today. Goal >88-90%  Continue singulair  (montelukast ) 1 tab At bedtime for allergies   Work on quitting smoking.    Attend repeat CT chest end of June 2025 with lung cancer screening program    Follow up in 4-6 months with Dr. Waylan Haggard or Katie Metta Koranda,NP. If symptoms do not improve or worsen, please contact office for sooner follow up or seek emergency care.

## 2024-01-10 ENCOUNTER — Telehealth: Payer: Self-pay | Admitting: Primary Care

## 2024-01-10 NOTE — Telephone Encounter (Signed)
 Cmn received from [facility] [employer]  [employer] for oxygen  concentrator.

## 2024-01-14 DIAGNOSIS — J9601 Acute respiratory failure with hypoxia: Secondary | ICD-10-CM | POA: Diagnosis not present

## 2024-01-14 NOTE — Telephone Encounter (Signed)
 Rc'd signed CMN for O2. Will fax and confirm to [phone].

## 2024-01-20 ENCOUNTER — Ambulatory Visit: Payer: No Typology Code available for payment source | Admitting: Nurse Practitioner

## 2024-02-01 DIAGNOSIS — E113291 Type 2 diabetes mellitus with mild nonproliferative diabetic retinopathy without macular edema, right eye: Secondary | ICD-10-CM | POA: Diagnosis not present

## 2024-02-01 DIAGNOSIS — E1169 Type 2 diabetes mellitus with other specified complication: Secondary | ICD-10-CM | POA: Diagnosis not present

## 2024-02-01 DIAGNOSIS — J449 Chronic obstructive pulmonary disease, unspecified: Secondary | ICD-10-CM | POA: Diagnosis not present

## 2024-02-01 DIAGNOSIS — J441 Chronic obstructive pulmonary disease with (acute) exacerbation: Secondary | ICD-10-CM | POA: Diagnosis not present

## 2024-02-03 ENCOUNTER — Telehealth: Payer: Self-pay

## 2024-02-03 NOTE — Telephone Encounter (Signed)
 LVM for patient to call and schedule 3 month follow up scan. Due 02/29/2024.

## 2024-02-06 IMAGING — CT CT CHEST W/ CM
1 of 2 series · 15 of 31 positions shown, 19 images · IV contrast (APPLIED)
Comparison: Chest x-ray 04/14/2021

CLINICAL DATA: Abnormal x-ray

EXAM:
CT CHEST WITH CONTRAST
TECHNIQUE: Multidetector CT imaging of the chest was performed during
intravenous contrast administration.

[Series 6: super d · axial · 0.68mm/px · z∈[-223,+91]mm · 15 of 441 slices shown, 19 images]
[im 24/441  mediastinal]
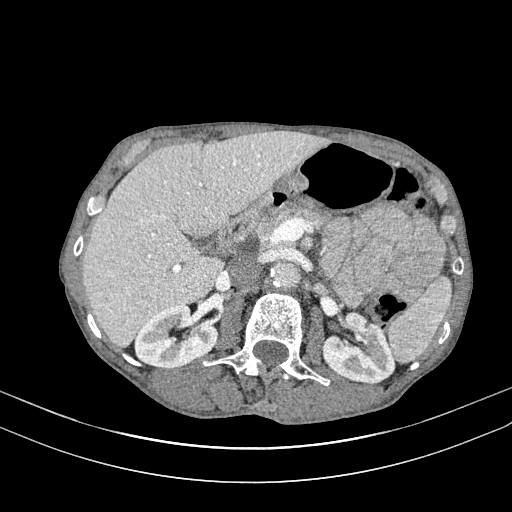
[im 24/441  lung]
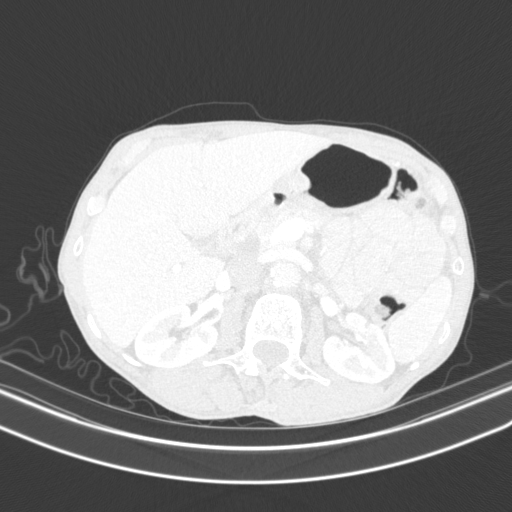
[im 70/441  lung]
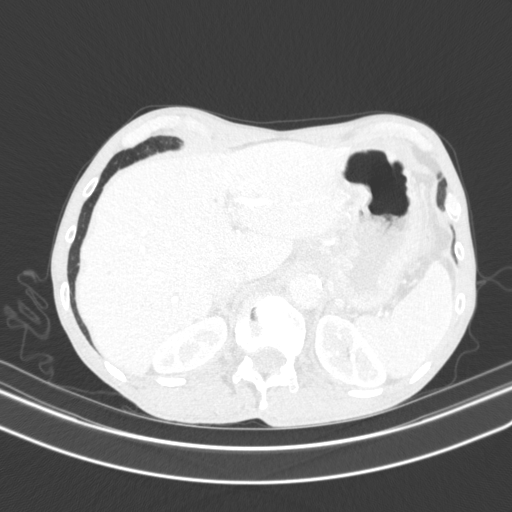
[im 93/441  lung]
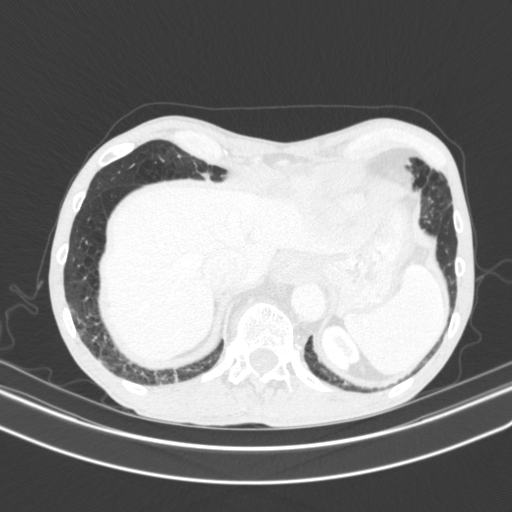
[im 116/441  lung]
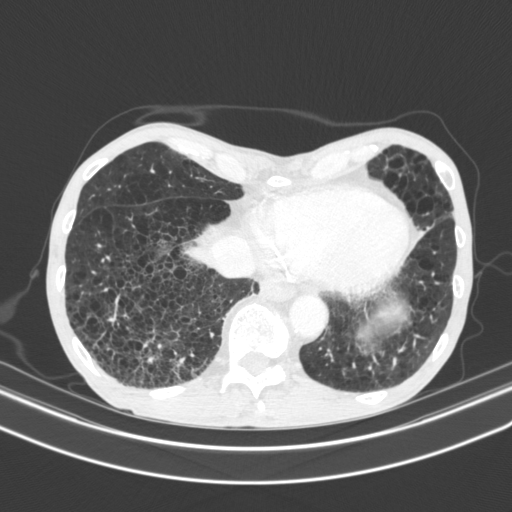
[im 147/441  mediastinal]
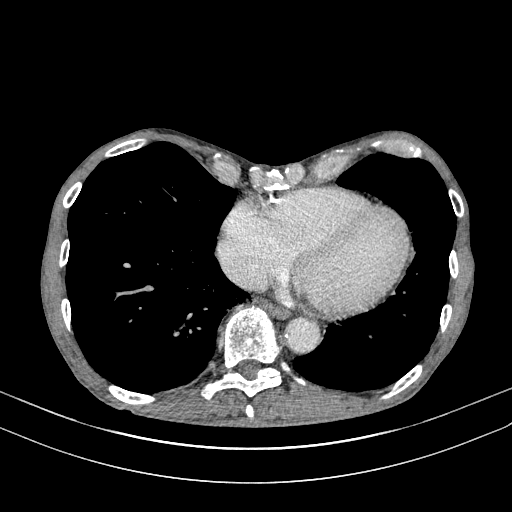
[im 147/441  lung]
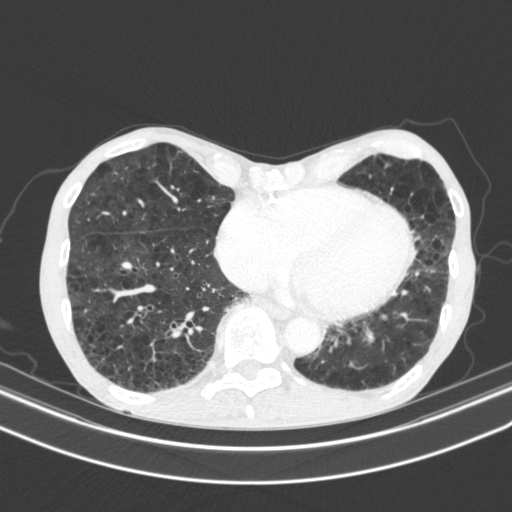
[im 163/441  lung]
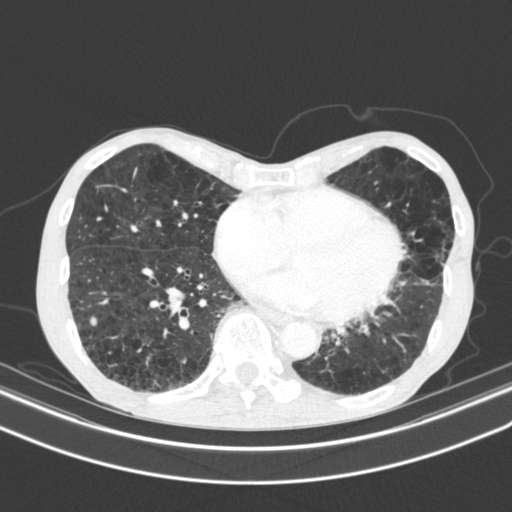
[im 186/441  lung]
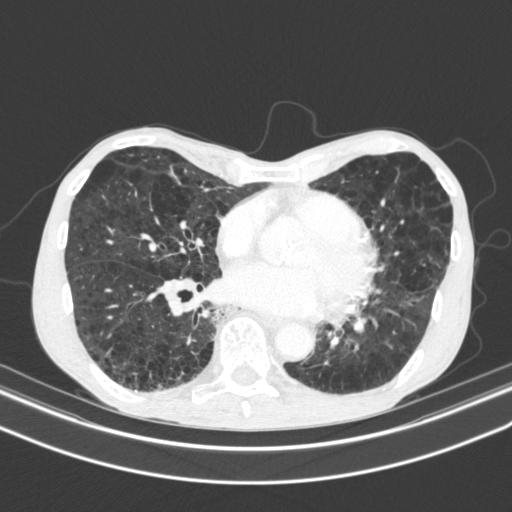
[im 232/441  lung]
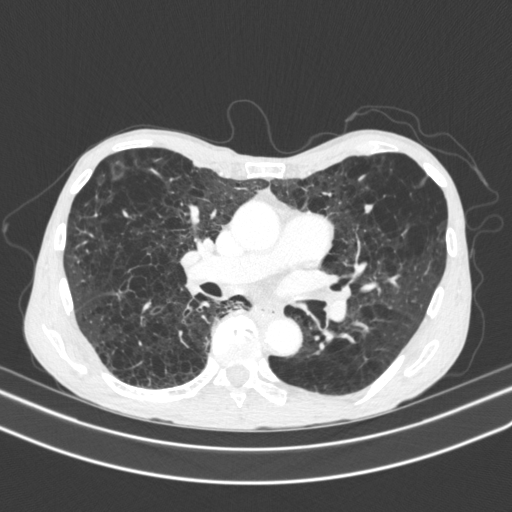
[im 255/441  mediastinal]
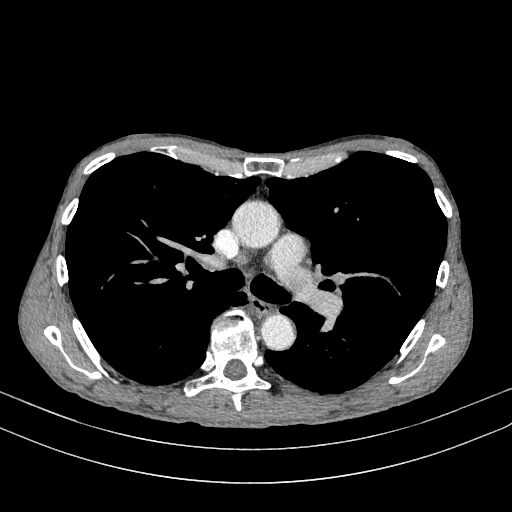
[im 255/441  lung]
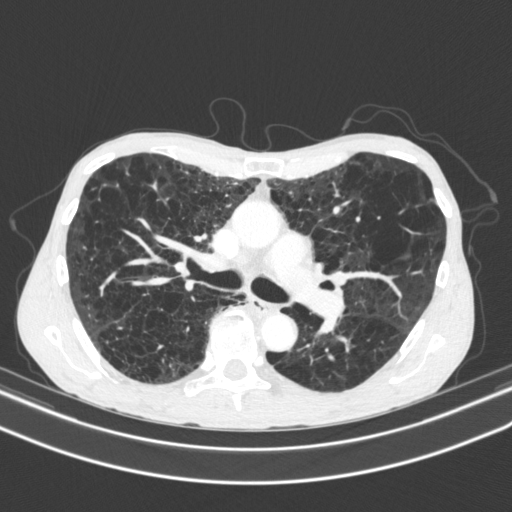
[im 278/441  lung]
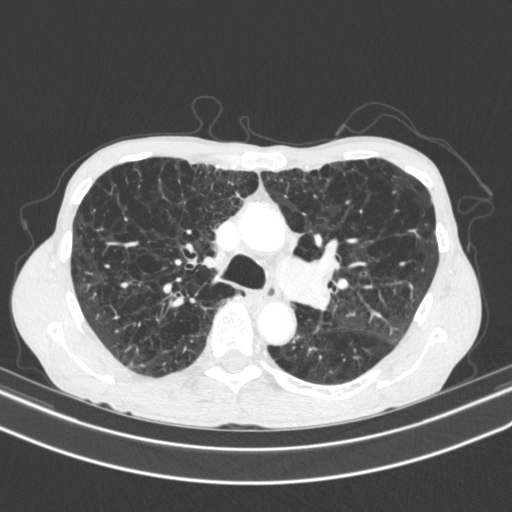
[im 302/441  lung]
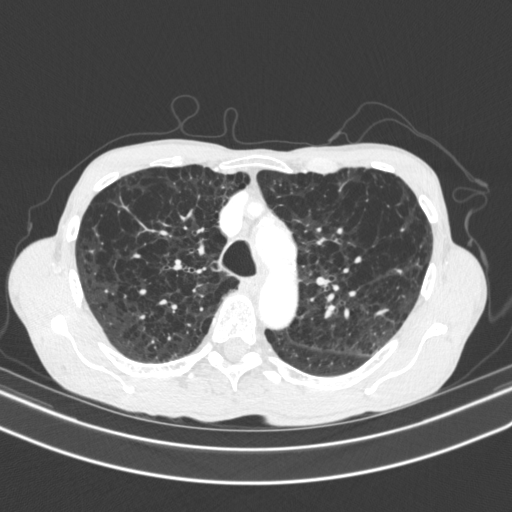
[im 325/441  lung]
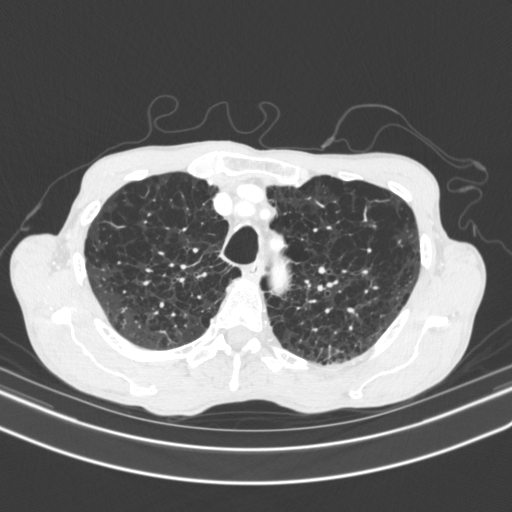
[im 348/441  mediastinal]
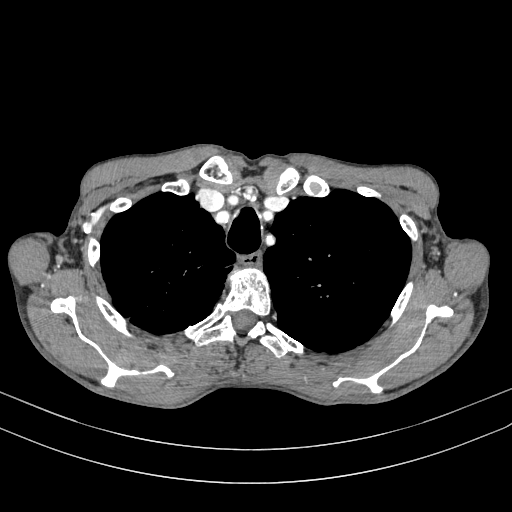
[im 348/441  lung]
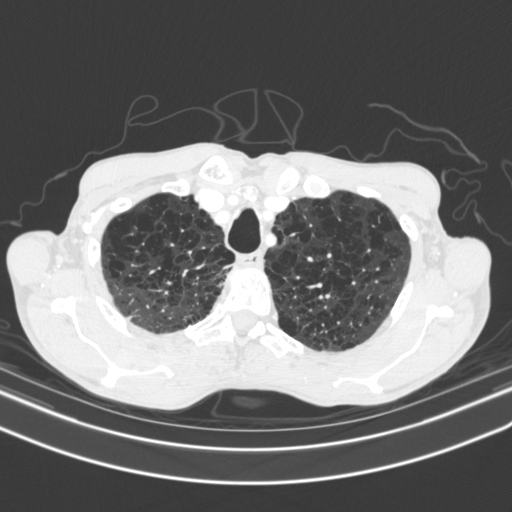
[im 394/441  lung]
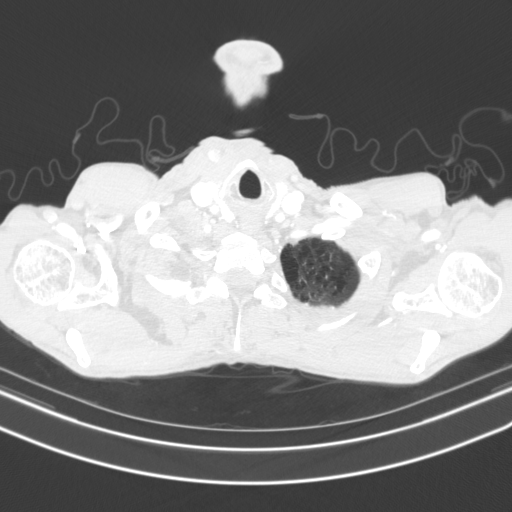
[im 417/441  lung]
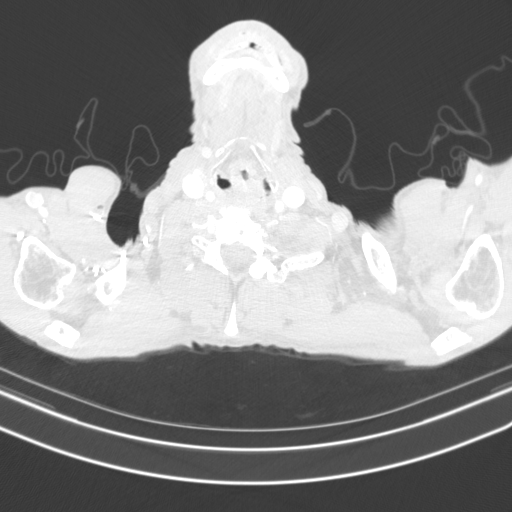

[15 of 31 positions shown; findings below may reference images not displayed]

RADIATION DOSE REDUCTION: This exam was performed according to the
departmental dose-optimization program which includes automated
exposure control, adjustment of the mA and/or kV according to
patient size and/or use of iterative reconstruction technique.

CONTRAST:  75mL QVX9K1-D11 IOPAMIDOL (QVX9K1-D11) INJECTION 61%
FINDINGS: Cardiovascular: Heart is normal size. Extensive coronary artery and
scattered aortic calcifications. No aneurysm.

Mediastinum/Nodes: No mediastinal, hilar, or axillary adenopathy.
Trachea and esophagus are unremarkable. Thyroid unremarkable. No
left hilar adenopathy as questioned on prior x-ray. Prominence of
the left hilum likely vascular. Mildly prominent central pulmonary
vessels could reflect pulmonary arterial hypertension.

Lungs/Pleura: Advanced emphysema. Right apical scarring. No
confluent opacities or effusions. No suspicious pulmonary nodules.

Upper Abdomen: No acute findings scattered subcentimeter
hypodensities throughout the liver, likely cysts, unchanged.

Musculoskeletal: Chest wall soft tissues are unremarkable. No acute
bony abnormality.
IMPRESSION: No left hilar mass as question on prior x-ray. This is likely
related to prominent central pulmonary vessels which may reflect
pulmonary arterial hypertension.

Extensive coronary artery disease.

Aortic Atherosclerosis (T6W4G-DY7.7) and Emphysema (T6W4G-FWK.0).

## 2024-02-14 ENCOUNTER — Encounter: Payer: Self-pay | Admitting: Nurse Practitioner

## 2024-02-14 DIAGNOSIS — J9601 Acute respiratory failure with hypoxia: Secondary | ICD-10-CM | POA: Diagnosis not present

## 2024-02-14 NOTE — Progress Notes (Signed)
 Unable to contact patient to scheudle carotid ultrasound. Letter sent, order cancelled.

## 2024-02-18 DIAGNOSIS — H25813 Combined forms of age-related cataract, bilateral: Secondary | ICD-10-CM | POA: Diagnosis not present

## 2024-02-18 DIAGNOSIS — H268 Other specified cataract: Secondary | ICD-10-CM | POA: Diagnosis not present

## 2024-02-19 ENCOUNTER — Telehealth: Payer: Self-pay | Admitting: Primary Care

## 2024-02-19 NOTE — Telephone Encounter (Signed)
 [employer] Jose Welch is caling cause patient  called in and he received a call regarding prior authorization was sent for oxygen  but the insurance company say they have not received anything  [phone] [employer] fax number to send prior authorization to

## 2024-02-19 NOTE — Telephone Encounter (Signed)
 Pt calling. [employer] took the call. This was in regards to ordering his O2. We fax'd the order last month. Apparently not rec'd. Refax'd today. (See Media Tab for a copy of the signed CMN.) Will wait to insure fax goes thru. ([facility] and I were disconnected before PT was tsf.)

## 2024-02-19 NOTE — Telephone Encounter (Signed)
 Called PT back. His ins co was billing him 1500.00. I called [facility] [employer] [facility] and they said they have what they need to bill his insurance now and it is in process. Not to worry about bill. Their # was [phone]

## 2024-02-26 NOTE — Telephone Encounter (Signed)
 Documentation faxed to [employer], pt was made aware.  Copied from CRM [id number]. Topic: Clinical - Lab/Test Results >> Feb 17, 2024  9:42 AM Jose Welch wrote: Reason for CRM:   Pt had walk test performed at last visit. Testing was never received by insurance, so he was billed for his oxygen . He is requesting the testing document, that reports he needs oxygen  be sent to [employer].  CB#  [phone]

## 2024-02-27 NOTE — Telephone Encounter (Signed)
 Spoke with patient regarding prior message.Patient stated he needs a order sent to [employer] for them to pay and for patient to keep his oxygen .Advised patient I will look in the boxes and see what information I can find. Unable to find any information on this patient . It looks as Jose Welch has filled out the paper and was faxed back to [facility] [employer]  on 01/14/24

## 2024-03-02 ENCOUNTER — Telehealth: Payer: Self-pay

## 2024-03-02 DIAGNOSIS — J441 Chronic obstructive pulmonary disease with (acute) exacerbation: Secondary | ICD-10-CM | POA: Diagnosis not present

## 2024-03-02 DIAGNOSIS — J449 Chronic obstructive pulmonary disease, unspecified: Secondary | ICD-10-CM | POA: Diagnosis not present

## 2024-03-02 DIAGNOSIS — E1169 Type 2 diabetes mellitus with other specified complication: Secondary | ICD-10-CM | POA: Diagnosis not present

## 2024-03-02 DIAGNOSIS — E113291 Type 2 diabetes mellitus with mild nonproliferative diabetic retinopathy without macular edema, right eye: Secondary | ICD-10-CM | POA: Diagnosis not present

## 2024-03-02 NOTE — Telephone Encounter (Signed)
 Copied from CRM [id number]. Topic: General - Billing Inquiry >> Feb 28, 2024  3:20 PM Jose Welch wrote: Reason for CRM: Patient is calling to complain once again that June 18tth complaint has not been addressed. Patient states he is still getting bills from [facility]. Patient is very upset and states that he will be coming down to the office to make a scene if he is not updated promptly that is has been dealt with. Attempted to advise that, per last CRM note, this has been marked as in-progress but patient does not acknowledge understanding. >> Mar 02, 2024 11:01 AM Jose Welch wrote: Pt has called back in regarding the order for his oxygen . He states the order needs to be sent to [facility] so that they can pay their portion, and then order will be sent to [facility](DME company that will supply oxygen ). >> Mar 02, 2024  8:15 AM Jose Welch wrote: Refax'd forms that were faxed early last month to [employer] (Also, we rc'd a fax confirmation that it was rec'd.) Went to [employer] tab, printed and sent again.

## 2024-03-02 NOTE — Telephone Encounter (Signed)
 Called Pt to f/u on concerns with [employer] and [employer] for billing issue.   EMERSON Sharps and myself did confirm w/ Mitch from [employer] that the Auth is currently in process for the Pt through [facility].   I called and relayed this on over to him and he did state that he does have his oxygen , but he is trying to stop the bills from [facility] to ensure that he is able to keep it. I did explain that the process that [employer] is currently doing for him will assist with this. So, I did give him the number to [employer] & [facility]  in case there are any further f/u that he may need regarding his Oxygen . [phone])  At this time there is nothing further.

## 2024-03-02 NOTE — Telephone Encounter (Signed)
 Doc's fax's last month for PT to [employer]. We also rec'd a fax confirmation. Went to [employer] tab, reprinted and will fax again today.

## 2024-03-02 NOTE — Telephone Encounter (Signed)
 Spoke with patient reassure him that when have done everything we can on our end , paper work was re faxed this morning.  And it would be best like what was stated this morning to get a hold of the DME  make them aware that we have re faxed the information again.    NFN

## 2024-03-02 NOTE — Telephone Encounter (Signed)
 Copied from CRM [id number]. Topic: General - Other >> Feb 28, 2024  3:26 PM Russell PARAS wrote: Reason for CRM:   Burnard, with [facility], calling on behalf of pt. He is needing authorization for oxygen  sent to [facility].   CB#  [phone]  FX#  [phone]

## 2024-03-04 ENCOUNTER — Ambulatory Visit
Admission: RE | Admit: 2024-03-04 | Discharge: 2024-03-04 | Disposition: A | Discharge status: Home / Self Care | Source: Ambulatory Visit | Attending: Acute Care | Admitting: Acute Care

## 2024-03-04 DIAGNOSIS — Z122 Encounter for screening for malignant neoplasm of respiratory organs: Secondary | ICD-10-CM

## 2024-03-04 DIAGNOSIS — R911 Solitary pulmonary nodule: Secondary | ICD-10-CM

## 2024-03-04 DIAGNOSIS — F17210 Nicotine dependence, cigarettes, uncomplicated: Secondary | ICD-10-CM | POA: Diagnosis not present

## 2024-03-04 DIAGNOSIS — Z87891 Personal history of nicotine dependence: Secondary | ICD-10-CM

## 2024-03-10 NOTE — Telephone Encounter (Signed)
 Patient contacted the office again about a bill, spoke with Mitch at [employer] who will contact the appropriate person to address and they will reach out to the patient to update. Spoke with patient who states the bill is between [employer]/ insurance. Informed patient that he will need to contact [employer] and his insurance to get this resolved.

## 2024-03-10 NOTE — Telephone Encounter (Signed)
 [facility], they are needing  office note and demographics. Document faxed to [phone]. Called pt to let him know this was completed, Patient hung up, LVMTCB. CRM Burnard, with [employer], calling on behalf of pt. He is needing authorization for oxygen  sent to [facility].

## 2024-03-11 ENCOUNTER — Other Ambulatory Visit: Payer: Self-pay

## 2024-03-11 ENCOUNTER — Telehealth: Payer: Self-pay

## 2024-03-11 DIAGNOSIS — J449 Chronic obstructive pulmonary disease, unspecified: Secondary | ICD-10-CM

## 2024-03-11 NOTE — Telephone Encounter (Incomplete Revision)
 [facility]  is needing an updated order and clinical notes stating patient still needs POC.  A new order was placed for patients POC, awaiting Katie's signature. Notified patient regarding message.

## 2024-03-11 NOTE — Telephone Encounter (Addendum)
 Received CMN. Signed and faxed to [facility] at [phone]. Received fax confirmation.

## 2024-03-11 NOTE — Telephone Encounter (Addendum)
 [facility]  is needing an updated order and clinical notes stating patient still needs POC.  A new order was placed for patients POC, awaiting Katie's signature. Notified patient regarding message.

## 2024-03-15 DIAGNOSIS — J9601 Acute respiratory failure with hypoxia: Secondary | ICD-10-CM | POA: Diagnosis not present

## 2024-03-17 ENCOUNTER — Telehealth: Payer: Self-pay | Admitting: Acute Care

## 2024-03-17 DIAGNOSIS — R911 Solitary pulmonary nodule: Secondary | ICD-10-CM

## 2024-03-17 NOTE — Telephone Encounter (Signed)
 Spoke with patient by phone.  Informed of recommendation to repeat LDCT again in 3 months due to new nodules.  Some nodules have resolved but new ones appeared.  Patient states he has difficulty with his breathing on and off most of the time and he also has morning congestion in his chest but this is not any worse than his usual.  Patient verbalized understanding of repeating the CT but has some concerns about his out of pocket expenses.  Advised he could call his insurance and inquire since he had a previous follow up CT they could use the same code.  He has not received a bill for that CT as of yet.  Patient is agreeable to ordering the scan for October but he is going to see how the previous follow up CT affected his out of pocket expenses.  Stressed the importance of following up and patient states he understands, he just has concerns about paying his bills.  Results and plan faxed to PCP. Order placed for follow up LDCT

## 2024-03-17 NOTE — Telephone Encounter (Signed)
 Results reviewed by Lauraine Lites NP and due to new 6.56mm nodule Lauraine is in agreement with a 3 month follow up LDCT. Will need to inform patient and PCP.    IMPRESSION: 1. Lung-RADS 4A, suspicious. Follow up low-dose chest CT without contrast in 3 months (please use the following order, CT CHEST LCS NODULE FOLLOW-UP W/O CM) is recommended. Alternatively, PET may be considered when there is a solid component 8mm or larger. 2. Diffuse bronchial wall thickening with emphysema, as above; imaging findings suggestive of underlying COPD. Emphysema (ICD10-J43.9). 3.  Aortic Atherosclerosis (ICD10-I70.0).   These results will be called to the ordering clinician or representative by the Radiologist Assistant, and communication documented in the PACS or [employer].     Electronically Signed   By: Waddell Calk M.D.   On: 03/16/2024 06:11

## 2024-03-18 ENCOUNTER — Telehealth: Payer: Self-pay | Admitting: Primary Care

## 2024-03-18 NOTE — Telephone Encounter (Signed)
 CMN received from [facility] for re certification of home oxygen . Signed by Landry and sent back to [facility] with [facility]. Placed in scan.

## 2024-03-19 DIAGNOSIS — M25473 Effusion, unspecified ankle: Secondary | ICD-10-CM | POA: Diagnosis not present

## 2024-03-31 ENCOUNTER — Telehealth: Payer: Self-pay | Admitting: Primary Care

## 2024-03-31 NOTE — Telephone Encounter (Signed)
 PCC's, shouldn't the insurance company get a hold of the order first any ways?

## 2024-03-31 NOTE — Telephone Encounter (Signed)
 Copied from CRM [id number]. Topic: General - Billing Inquiry >> Mar 30, 2024  4:49 PM Shona S wrote: Reason for CRM: patient says insurance company needs a copy of his order for his recent dme order to pay the bill. Please complete.

## 2024-04-01 NOTE — Telephone Encounter (Signed)
 Jose Welch Jose Welch Jose Welch Jose Welch; Tucker, Dolanda; Sheree, Merilee Thank you have i noted pt account with this info.

## 2024-04-02 DIAGNOSIS — J441 Chronic obstructive pulmonary disease with (acute) exacerbation: Secondary | ICD-10-CM | POA: Diagnosis not present

## 2024-04-02 DIAGNOSIS — E113291 Type 2 diabetes mellitus with mild nonproliferative diabetic retinopathy without macular edema, right eye: Secondary | ICD-10-CM | POA: Diagnosis not present

## 2024-04-02 DIAGNOSIS — E1169 Type 2 diabetes mellitus with other specified complication: Secondary | ICD-10-CM | POA: Diagnosis not present

## 2024-04-02 DIAGNOSIS — J449 Chronic obstructive pulmonary disease, unspecified: Secondary | ICD-10-CM | POA: Diagnosis not present

## 2024-04-03 IMAGING — DX DG CHEST 2V
2 series · 2 of 2 positions shown · non-contrast
Comparison: Radiographs 04/14/2021.  CT 11/14/2021.

CLINICAL DATA: Hypoxia.  Shortness of breath.

EXAM:
CHEST - 2 VIEW

[chest pa]
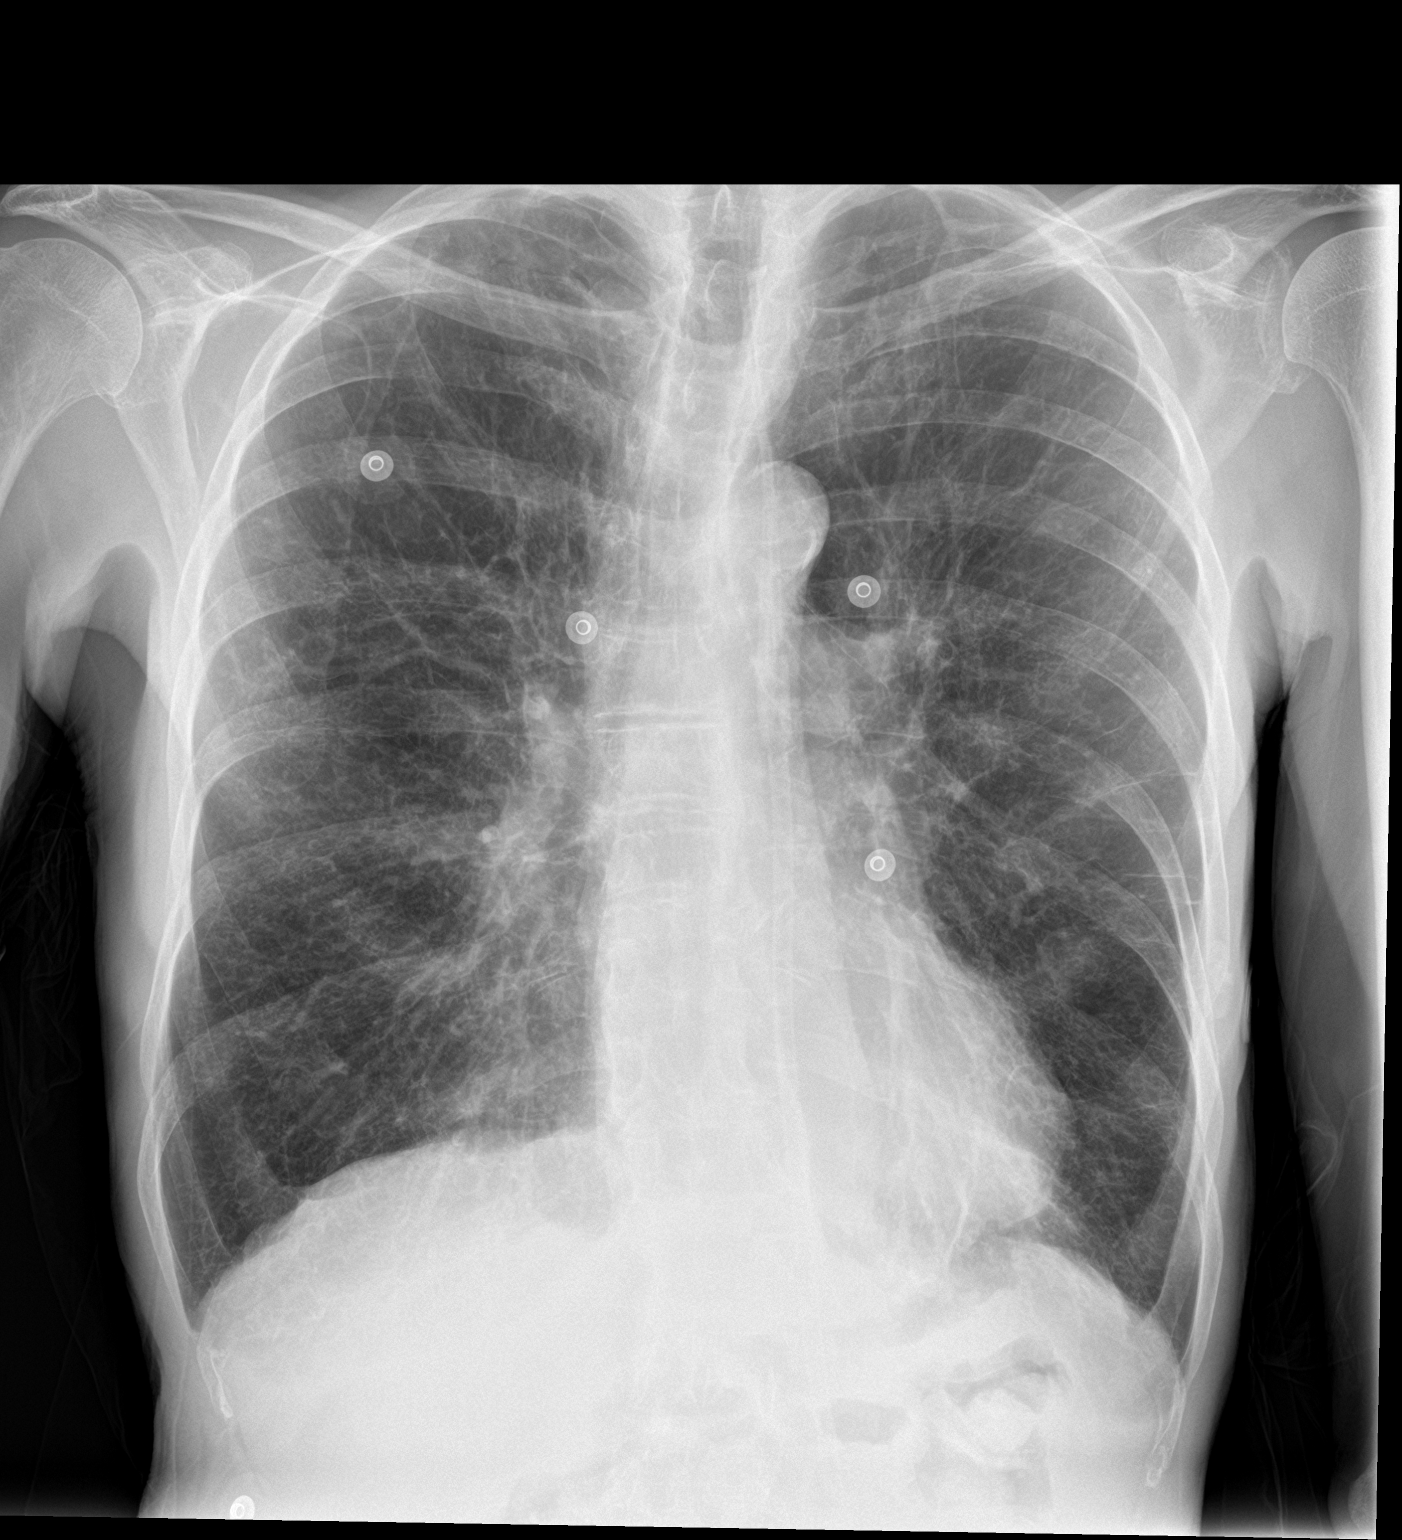

[chest lat]
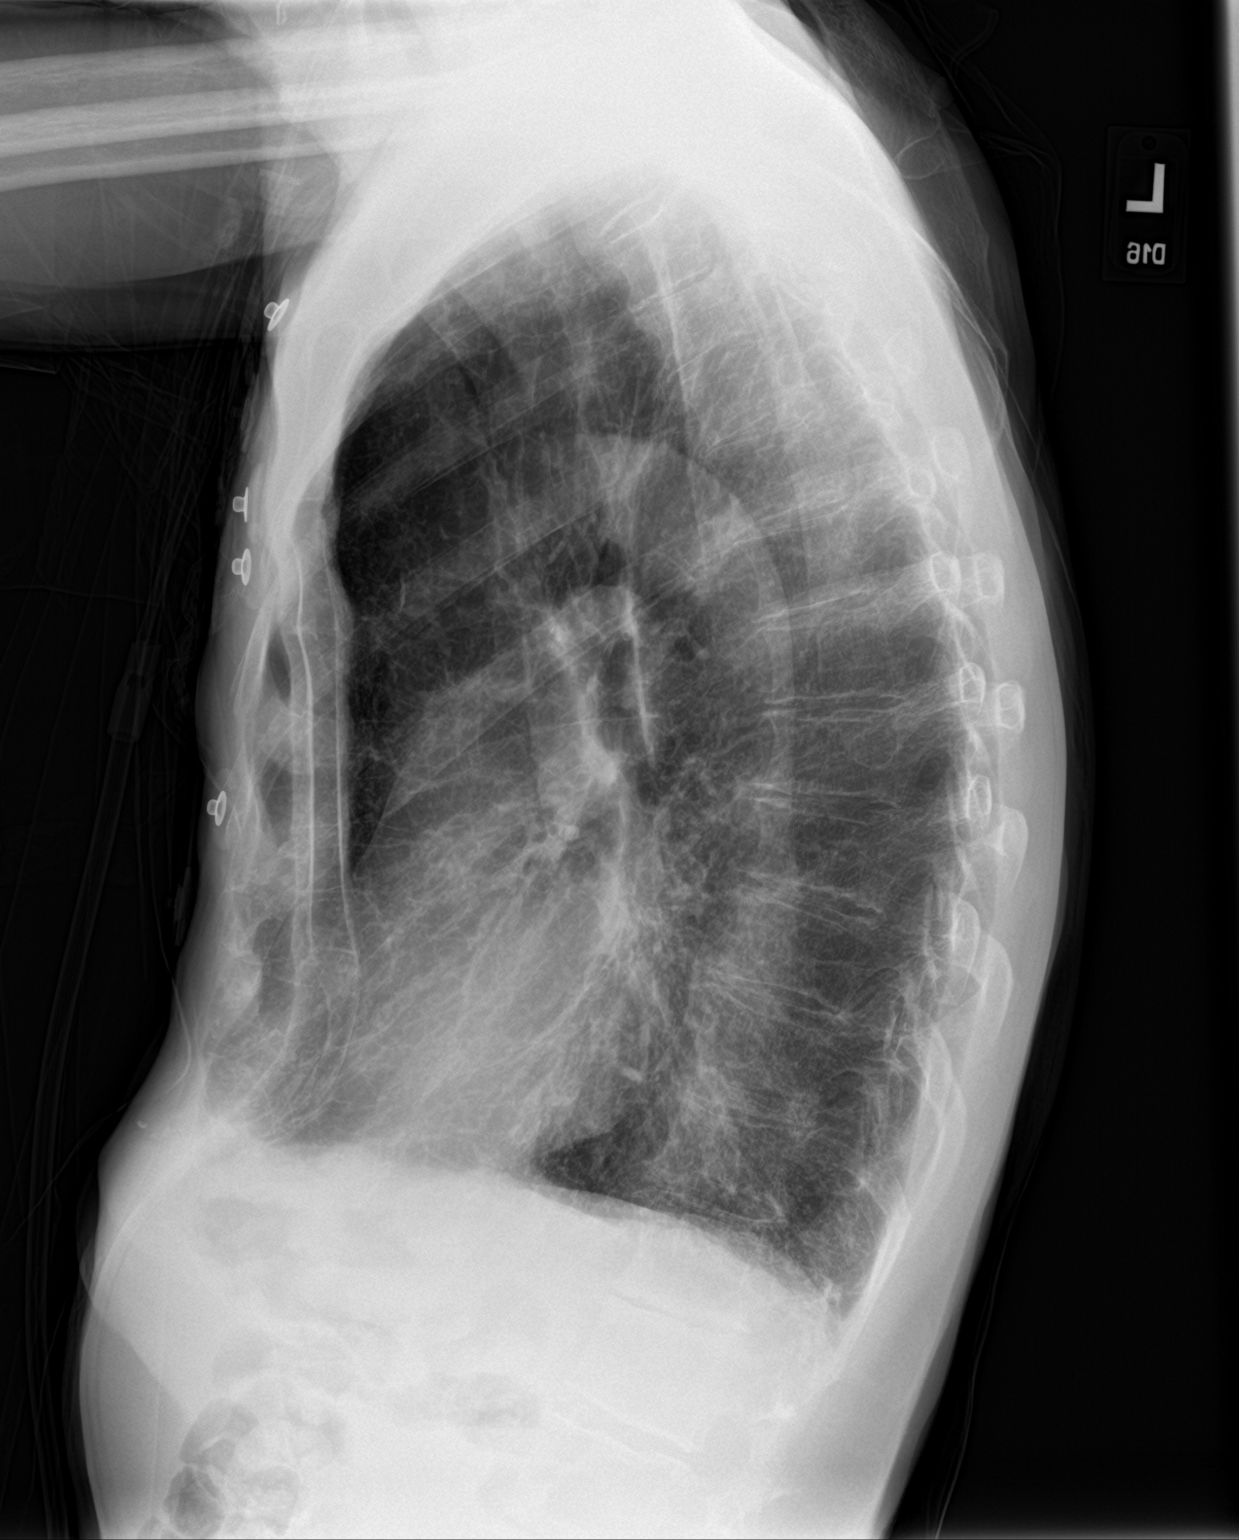

[2 of 2 positions shown; findings below may reference images not displayed]

FINDINGS: Mild patient rotation to the left. The heart size and mediastinal
contours are stable with aortic atherosclerosis. Underlying pectus
deformity of the sternum noted. The lungs are hyperinflated with
stable scattered mild linear scarring. No edema, confluent airspace
opacity, pleural effusion or pneumothorax. No acute osseous findings
are evident.
IMPRESSION: Chronic obstructive pulmonary disease and emphysema. No evidence of
acute cardiopulmonary process.

## 2024-04-15 DIAGNOSIS — J9601 Acute respiratory failure with hypoxia: Secondary | ICD-10-CM | POA: Diagnosis not present

## 2024-04-15 DIAGNOSIS — J449 Chronic obstructive pulmonary disease, unspecified: Secondary | ICD-10-CM | POA: Diagnosis not present

## 2024-05-03 DIAGNOSIS — E1169 Type 2 diabetes mellitus with other specified complication: Secondary | ICD-10-CM | POA: Diagnosis not present

## 2024-05-03 DIAGNOSIS — J441 Chronic obstructive pulmonary disease with (acute) exacerbation: Secondary | ICD-10-CM | POA: Diagnosis not present

## 2024-05-03 DIAGNOSIS — E113291 Type 2 diabetes mellitus with mild nonproliferative diabetic retinopathy without macular edema, right eye: Secondary | ICD-10-CM | POA: Diagnosis not present

## 2024-05-03 DIAGNOSIS — J449 Chronic obstructive pulmonary disease, unspecified: Secondary | ICD-10-CM | POA: Diagnosis not present

## 2024-05-13 IMAGING — CT CT CHEST W/O CM
2 of 5 series · 15 of 36 positions shown, 18 images · non-contrast
Comparison: 11/14/2021 chest CT.

CLINICAL DATA: Follow-up pulmonary nodule.  Current smoker.



[Series 4: chest 2.00 br40 s3 · coronal · 0.65mm/px · 3 of 125 slices shown]
[im 25/125  lung]
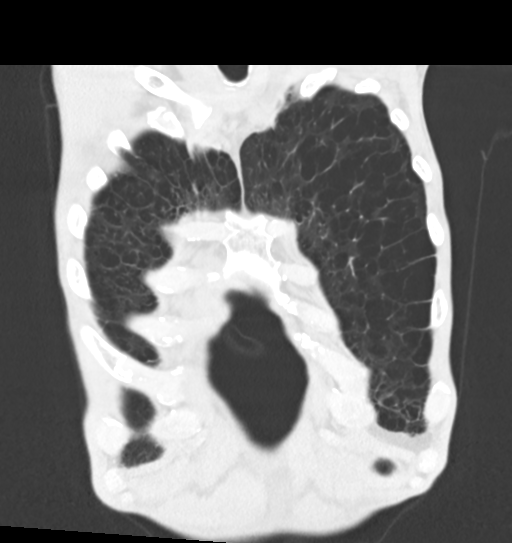
[im 50/125  lung]
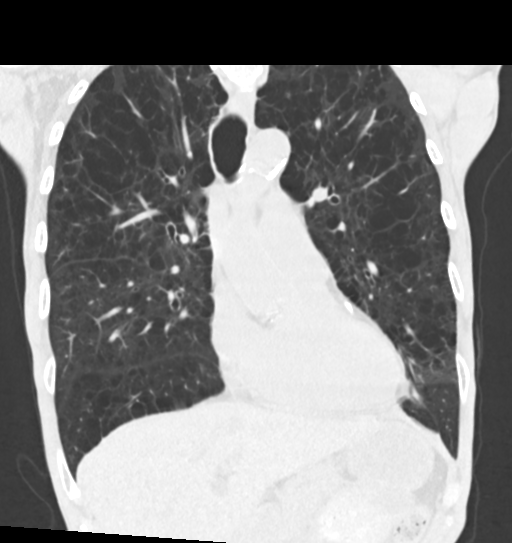
[im 75/125  lung]
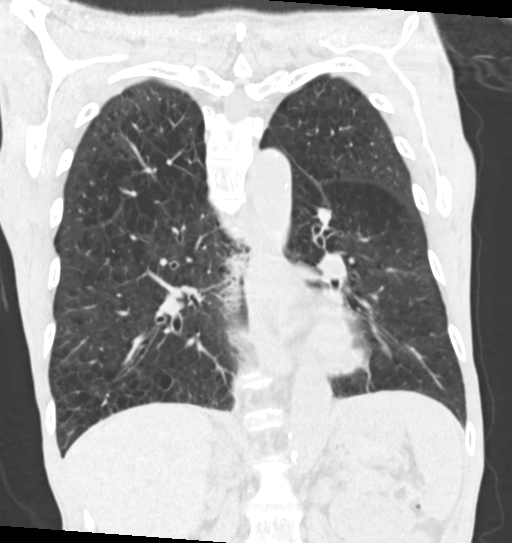

[Series 10: chest 1.00 br40 s3 super d · axial · 0.65mm/px · z∈[+1489,+1791]mm · 12 of 438 slices shown, 15 images]
[im 30/438  mediastinal]
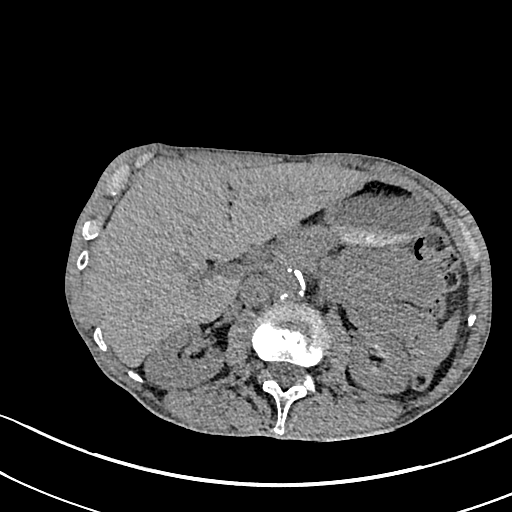
[im 30/438  lung]
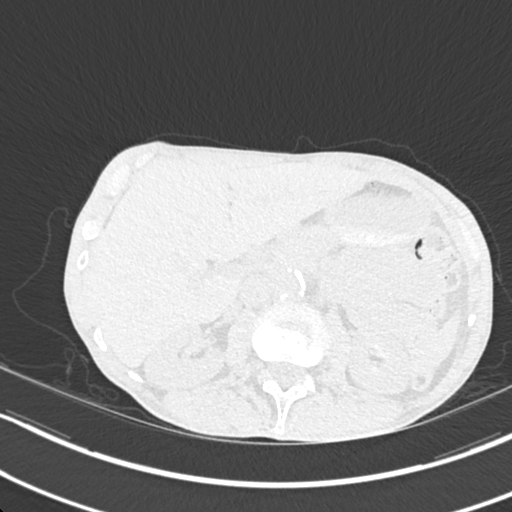
[im 59/438  lung]
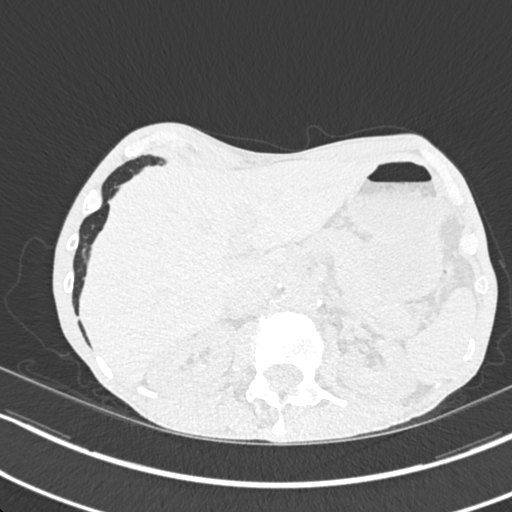
[im 88/438  lung]
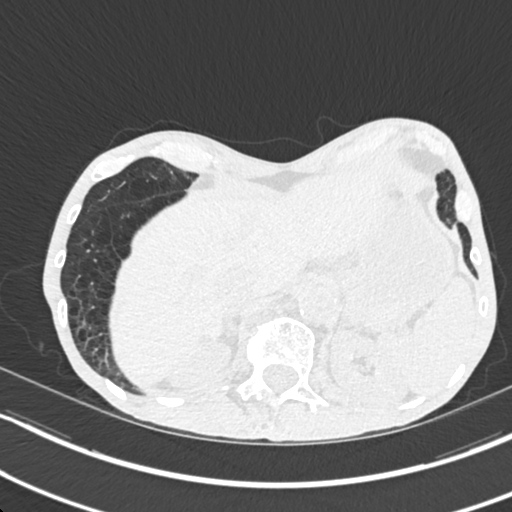
[im 146/438  lung]
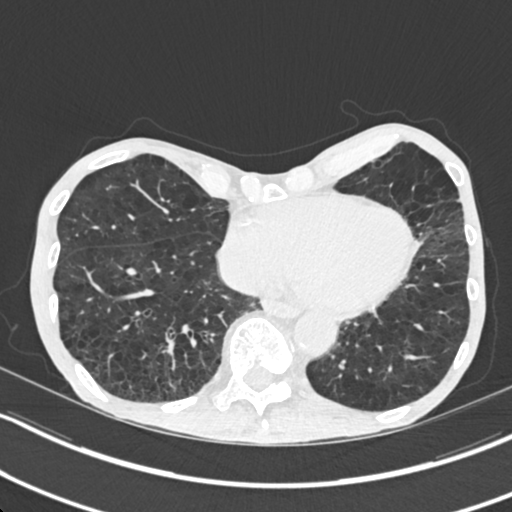
[im 175/438  mediastinal]
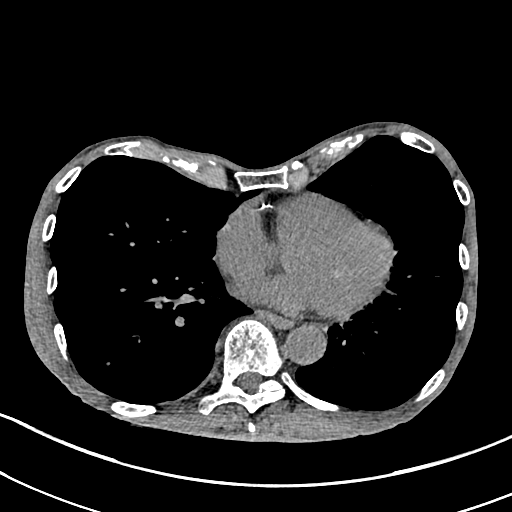
[im 175/438  lung]
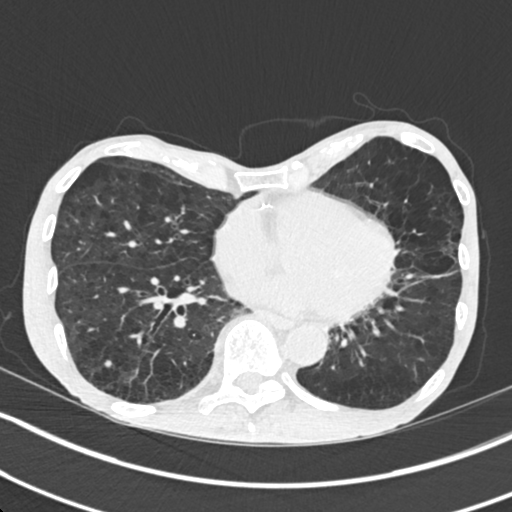
[im 204/438  lung]
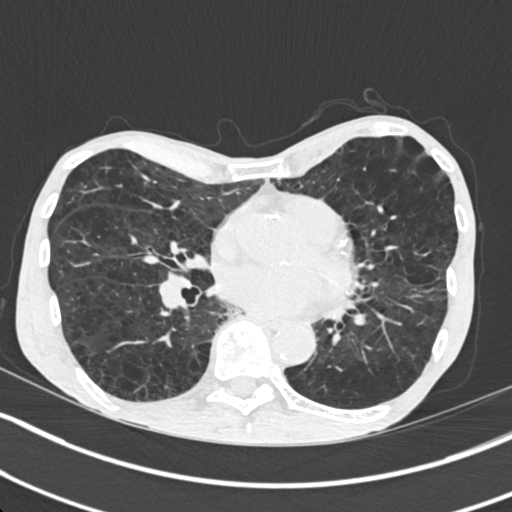
[im 234/438  lung]
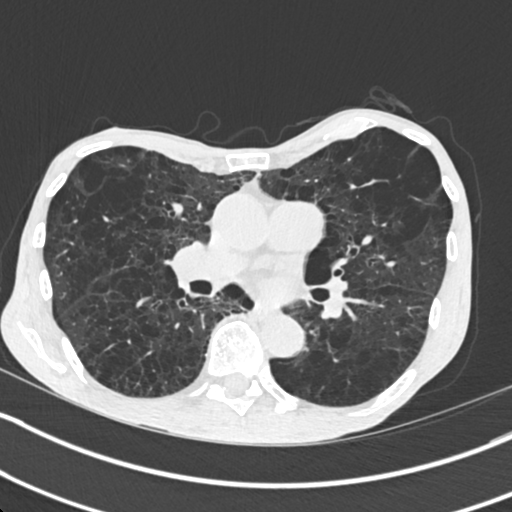
[im 263/438  lung]
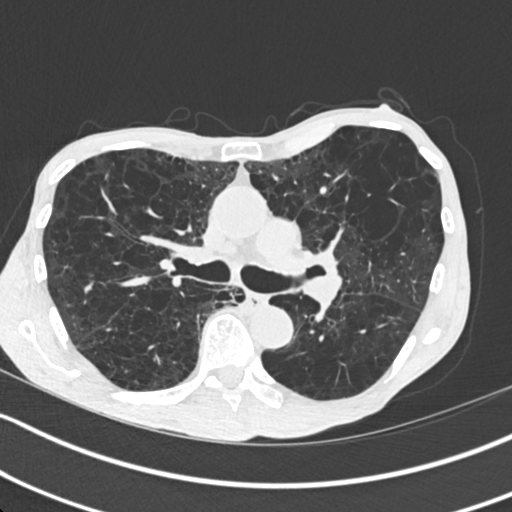
[im 292/438  mediastinal]
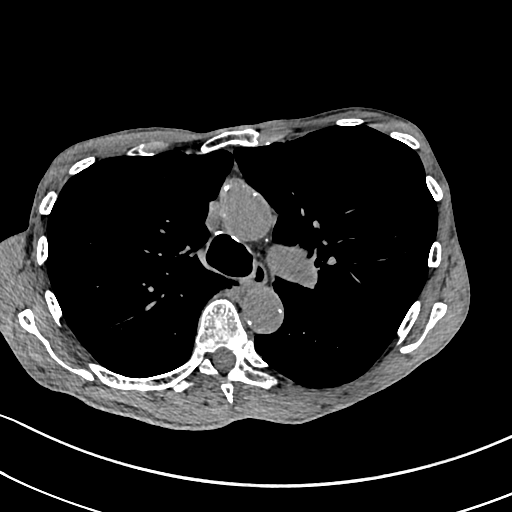
[im 292/438  lung]
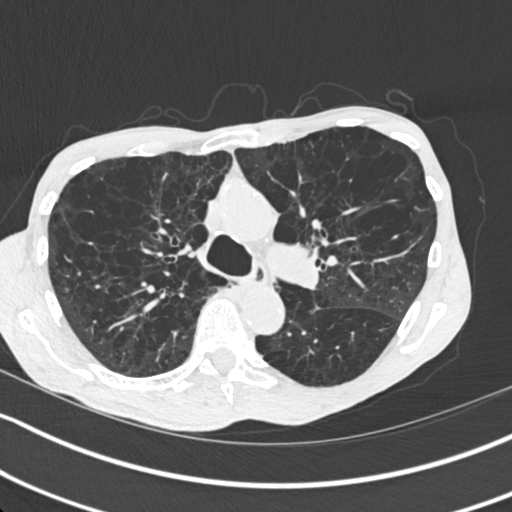
[im 350/438  lung]
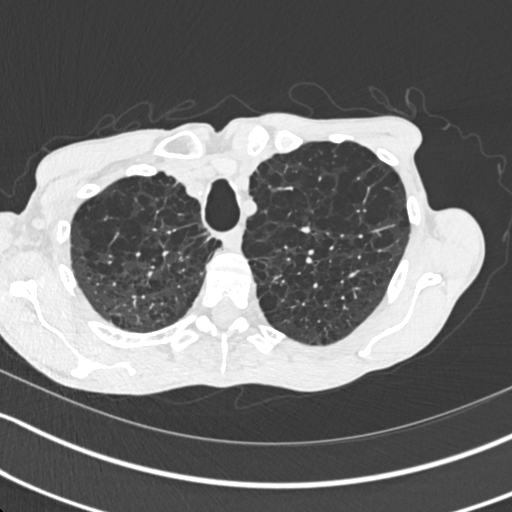
[im 379/438  lung]
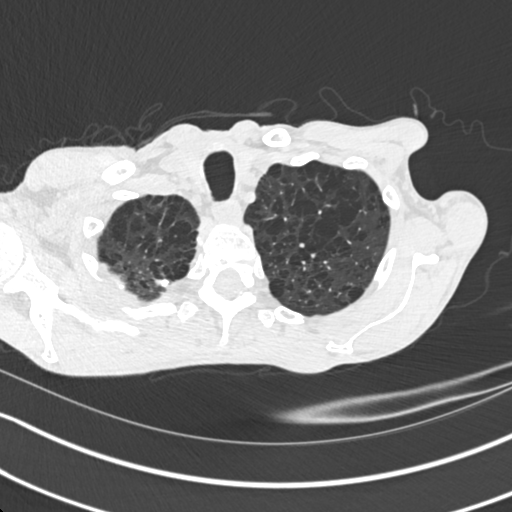
[im 408/438  lung]
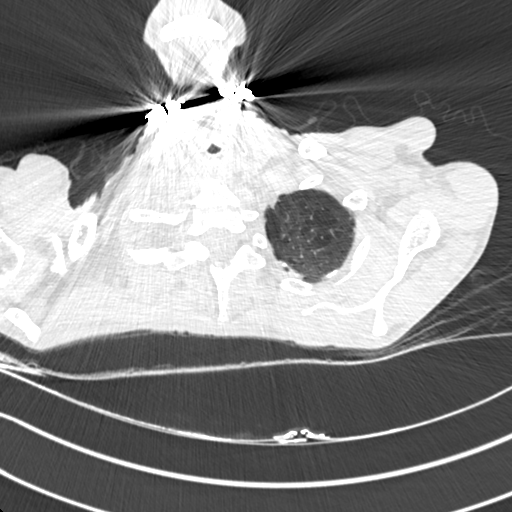

[15 of 36 positions shown; findings below may reference images not displayed]

FINDINGS: Cardiovascular: Normal heart size. No significant pericardial
effusion/thickening. Three-vessel coronary atherosclerosis.
Atherosclerotic nonaneurysmal thoracic aorta. Normal caliber
pulmonary arteries.

Mediastinum/Nodes: No discrete thyroid nodules. Unremarkable
esophagus. No pathologically enlarged axillary, mediastinal or hilar
lymph nodes, noting limited sensitivity for the detection of hilar
adenopathy on this noncontrast study.

Lungs/Pleura: No pneumothorax. No pleural effusion. Severe
centrilobular emphysema. No acute consolidative airspace disease or
lung masses. Solid 0.3 cm posterior right lower lobe pulmonary
nodule (series 8/image 96) is new. Three additional scattered solid
right pulmonary nodules, largest 0.6 cm in the peripheral right
lower lobe (series 8/image 107) are all stable since 01/07/2006
chest CT and considered benign.

Upper abdomen: Simple 1.1 cm posterior right liver cyst is stable.
Numerous additional scattered subcentimeter hypodense liver lesions
are too small to characterize and appear stable, considered benign.

Musculoskeletal: No aggressive appearing focal osseous lesions.
Moderate thoracic spondylosis.
IMPRESSION: 1. New solid 0.3 cm posterior right lower lobe pulmonary nodule.
Recommend attention on follow-up noncontrast chest CT in 6-12 months
in this high risk patient.
2. Three-vessel coronary atherosclerosis.
3. Aortic Atherosclerosis (RA1UC-84C.C) and Emphysema (RA1UC-57Y.1).

## 2024-05-18 DIAGNOSIS — J449 Chronic obstructive pulmonary disease, unspecified: Secondary | ICD-10-CM | POA: Diagnosis not present

## 2024-05-18 DIAGNOSIS — J9611 Chronic respiratory failure with hypoxia: Secondary | ICD-10-CM | POA: Diagnosis not present

## 2024-05-19 DIAGNOSIS — J449 Chronic obstructive pulmonary disease, unspecified: Secondary | ICD-10-CM | POA: Diagnosis not present

## 2024-05-22 ENCOUNTER — Telehealth: Payer: Self-pay | Admitting: Nurse Practitioner

## 2024-05-22 DIAGNOSIS — J9611 Chronic respiratory failure with hypoxia: Secondary | ICD-10-CM

## 2024-05-22 NOTE — Telephone Encounter (Signed)
**Note De-identified  Woolbright Obfuscation** Please advise 

## 2024-05-22 NOTE — Telephone Encounter (Signed)
 Copied from CRM [id number]. Topic: Clinical - Order For Equipment >> May 22, 2024 12:35 PM Devaughn RAMAN wrote: Reason for CRM: Patient and [name] with [email or web address] called and stated the patient switched his insurance on September 1st and the DME provider for his oxygen  is no longer in network and [facility] is the new provider and they need a prescription to be sent over rfor his oxygen .

## 2024-05-26 ENCOUNTER — Telehealth: Payer: Self-pay | Admitting: Nurse Practitioner

## 2024-05-26 ENCOUNTER — Encounter: Payer: Self-pay | Admitting: Acute Care

## 2024-05-26 NOTE — Telephone Encounter (Signed)
 Fine to send rx to new DME provider for O2; however, he may have to have OV with walk test first. Thanks.

## 2024-05-26 NOTE — Telephone Encounter (Signed)
 Copied from CRM [id number]. Topic: General - Other >> May 25, 2024  1:50 PM Jose Welch wrote: Reason for CRM: Patient is calling to state that he e-faxed something to our office regarding insurance. Patient is requesting this be filled out and mailed back to him.   Please update patient.   Nothing received up front.

## 2024-05-26 NOTE — Telephone Encounter (Signed)
 Copied from CRM [id number]. Topic: Clinical - Order For Equipment >> May 22, 2024 12:35 PM Jose Welch wrote: Reason for CRM: Patient and [name] with [email or web address] called and stated the patient switched his insurance on September 1st and the DME provider for his oxygen  is no longer in network and [facility] is the new provider and they need a prescription to be sent over rfor his oxygen . >> May 26, 2024 10:31 AM Jose Welch wrote: Pt and [name] with [email or web address], are contacting clinic regarding the order for equipment being sent to his new DME company [facility]. Advised provider approved order being sent, according to notes from 9/23, and the the order should be sent to [facility] in the next few days.   Pt requested call back once order has been sent  CB#  [phone], please leave detailed message if he does not answer

## 2024-05-28 NOTE — Telephone Encounter (Signed)
 New order placed.NFn

## 2024-05-28 NOTE — Telephone Encounter (Signed)
 Called patient in regards to order being placed,patient verbalized understanding.NFN

## 2024-05-28 NOTE — Addendum Note (Signed)
 Addended by: MELVENIA WILFORD SAUNDERS on: 05/28/2024 10:44 AM   Modules accepted: Orders

## 2024-05-29 ENCOUNTER — Ambulatory Visit: Payer: Self-pay | Admitting: Pulmonary Disease

## 2024-05-29 NOTE — Telephone Encounter (Signed)
 Patient is needing a face to face appointment with pulmonary in regards to oxygen . Patient recently changed insurances and is needing a face to face required by his new insurance. Patient endorses some shortness of breath at times but states he is doing okay. Needing a follow up call from office to schedule. ED precautions were given to patient  FYI Only or Action Required?: Action required by provider: request for appointment. Needing a face to face for oxygen  refill. Required by new insurance.   Patient is followed in Pulmonology for COPD, last seen on 01/09/2024 by Malachy Comer GAILS, NP.  Called Nurse Triage reporting Shortness of Breath.  Symptoms began several weeks ago.  Interventions attempted: Rescue inhaler and Maintenance inhaler.  Symptoms are: unchanged.  Triage Disposition: See PCP Within 2 Weeks  Patient/caregiver understands and will follow disposition?: No, wishes to speak with PCP Copied from CRM #[id number]. Topic: Clinical - Red Word Triage >> May 29, 2024 12:31 PM Leila BROCKS wrote: Red Word that prompted transfer to Nurse Triage: Patient [phone] states is calling the office regarding an appointment due to oxygen , patient could not provide any details. Per CAL, clinicial staff called patient yesterday the oxygen  is being placed. Patient needs an appointment with NP, Cobb or Dr. Marylou. Patient's insurance has changed to [employer] ID# [id number] and [facility] is the DME and was advised to have a face to face per [facility] for refill lov 01/09/24. Patient states more shortness of breath in the past two weeks, unsure of wheezing and wants to be seen sooner. Patient denies pain, no fever. Please advise.      ----------------------------------------------------------------------- From previous Reason for Contact - Scheduling: Patient/patient representative is calling to schedule an appointment. Refer to attachments for appointment information. Reason for  Disposition  [1] MILD longstanding difficulty breathing (e.g., minimal/no SOB at rest, SOB with walking, pulse < 100) AND [2] SAME as normal  Answer Assessment - Initial Assessment Questions 1. RESPIRATORY STATUS: Describe your breathing? (e.g., wheezing, shortness of breath, unable to speak, severe coughing)      Increased shortness of breath with activity 2. ONSET: When did this breathing problem begin?      Started two weeks ago 3. PATTERN Does the difficult breathing come and go, or has it been constant since it started?      Comes and goes 4. SEVERITY: How bad is your breathing? (e.g., mild, moderate, severe)      mild 5. RECURRENT SYMPTOM: Have you had difficulty breathing before? If Yes, ask: When was the last time? and What happened that time?      yes 6. CARDIAC HISTORY: Do you have any history of heart disease? (e.g., heart attack, angina, bypass surgery, angioplasty)      no 7. LUNG HISTORY: Do you have any history of lung disease?  (e.g., pulmonary embolus, asthma, emphysema)     COPD 8. CAUSE: What do you think is causing the breathing problem?      unsure 9. OTHER SYMPTOMS: Do you have any other symptoms? (e.g., chest pain, cough, dizziness, fever, runny nose)     Runny nose, dizziness 10. O2 SATURATION MONITOR:  Do you use an oxygen  saturation monitor (pulse oximeter) at home? If Yes, ask: What is your reading (oxygen  level) today? What is your usual oxygen  saturation reading? (e.g., 95%)       Patient reports he is on 6L [place] regularly. 93-95% on 6L 12. TRAVEL: Have you traveled out of the country in the last month? (  e.g., travel history, exposures)       no  Protocols used: Breathing Difficulty-A-AH

## 2024-05-29 NOTE — Telephone Encounter (Signed)
 Pt scheduled ov 9/29 with Dr. Theophilus

## 2024-06-01 ENCOUNTER — Encounter: Payer: Self-pay | Admitting: Pulmonary Disease

## 2024-06-01 ENCOUNTER — Telehealth: Payer: Self-pay | Admitting: *Deleted

## 2024-06-01 ENCOUNTER — Ambulatory Visit (INDEPENDENT_AMBULATORY_CARE_PROVIDER_SITE_OTHER): Payer: PRIVATE HEALTH INSURANCE | Admitting: Pulmonary Disease

## 2024-06-01 VITALS — BP 110/70 | HR 98 | Temp 98.3°F | Ht 67.0 in | Wt 104.0 lb

## 2024-06-01 DIAGNOSIS — J449 Chronic obstructive pulmonary disease, unspecified: Secondary | ICD-10-CM

## 2024-06-01 DIAGNOSIS — R911 Solitary pulmonary nodule: Secondary | ICD-10-CM | POA: Diagnosis not present

## 2024-06-01 DIAGNOSIS — F17210 Nicotine dependence, cigarettes, uncomplicated: Secondary | ICD-10-CM | POA: Diagnosis not present

## 2024-06-01 NOTE — Progress Notes (Signed)
 Jose Welch    [id number]    June 01, 1956  Primary Care Physician:Husain, Ardell, MD  Referring Physician: Ransom Ardell, MD [address] [place],  [place] [address]  Chief complaint: Follow up for COPD  HPI: 68 y.o. with COPD, type II diabetes mellitus, hyperlipidemia, admitted in May 2023 with acute hypoxic respiratory failure secondary to COPD exacerbation. Treated with bronchodilators, steroids azithromycin . Discharged on advair on Spiriva . This was later changed to breztri by primary care  Continues to have chronic dyspnea on exertion, cough with congestion. Denies any wheezing, fevers, chills  Interim history: Discussed the use of AI scribe software for clinical note transcription with the patient, who gave verbal consent to proceed.  History of Present Illness Jose Welch is a 68 year old male with severe COPD who presents for a consultation regarding his oxygen  therapy.  Chronic obstructive pulmonary disease (copd) symptoms and management - Severe COPD with ongoing symptoms - Uses 5 liters of supplemental oxygen  in the clinic and 6 liters at home - Concerned that 6 liters of oxygen  may be excessive due to frequent nasal discharge - Finds Breztri inhaler beneficial for breathing - Not currently using Singulair  - Uncertain about other medications, but takes a 10 mg tablet possibly for cholesterol  Tobacco use - Continues to smoke approximately one pack of cigarettes daily - Previous unsuccessful attempts to quit using nicotine  patches and oral medications  Pulmonary nodules - Recent CT scan in July revealed a new 6.4 mm lung nodule - Follow-up CT scan scheduled for next month  Exertional dyspnea and wheezing - Experiences wheezing when pushing a shopping cart - Considering use of a lightweight electric wheelchair for mobility during shopping trips    Relevant pulmonary history Pets: two dogs Occupation: works as a Production designer, 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 No further pillars or comforters Smoking history: 75 pack year smoker. Continues to smoke half pack per day Travel history: no significant travel Relevant family history: no family history of lung disease  Outpatient Encounter Medications as of 06/01/2024  Medication Sig   albuterol  (VENTOLIN  HFA) 108 (90 Base) MCG/ACT inhaler Inhale 1 puff into the lungs every 6 (six) hours as needed for shortness of breath.   ALPRAZolam (XANAX) 0.25 MG tablet 0.5 mg.   aspirin  EC 81 MG tablet Take 1 tablet (81 mg total) by mouth daily. Swallow whole.   atorvastatin  (LIPITOR) 10 MG tablet Take 10 mg by mouth daily.   azelastine  (ASTELIN ) 0.1 % nasal spray Place 2 sprays into both nostrils 2 (two) times daily. Use in each nostril as directed   BREZTRI AEROSPHERE 160-9-4.8 MCG/ACT AERO Inhale 2 puffs into the lungs in the morning and at bedtime.   Continuous Glucose Receiver (FREESTYLE LIBRE 2 READER) DEVI as directed to check blood sugars daily   Continuous Glucose Sensor (FREESTYLE LIBRE 2 SENSOR) MISC Used to check blood sugars daily, change every 14 days for 90 days   diclofenac  Sodium (VOLTAREN ) 1 % GEL Apply 4 g topically 4 (four) times daily. (Patient taking differently: Apply 4 g topically daily as needed (for pain).)   famotidine  (PEPCID ) 20 MG tablet Take 20 mg by mouth daily as needed for heartburn.   fluticasone  (FLONASE) 50 MCG/ACT nasal spray Place 1 spray into both nostrils daily as needed for allergies.   ipratropium (ATROVENT ) 0.06 % nasal spray Place 2 sprays into both nostrils 4 (four) times daily.  ivabradine  (CORLANOR) 7.5 MG TABS tablet Take tablets (15mg ) by mouth TWO hours prior to your cardiac CT scan.   meloxicam  (MOBIC ) 7.5 MG tablet Take 1 tablet (7.5 mg total) by mouth daily.   metFORMIN  (GLUCOPHAGE ) 500 MG tablet Take 500 mg by mouth 2 (two) times daily with a meal.   montelukast  (SINGULAIR ) 10 MG tablet Take 1 tablet (10 mg  total) by mouth at bedtime.   OXYGEN  Inhale 5-6 L into the lungs daily.   metoprolol  tartrate (LOPRESSOR ) 100 MG tablet Take 1 tablet (100mg ) TWO hours prior to CT scan (Patient not taking: Reported on 06/01/2024)   metoprolol  tartrate (LOPRESSOR ) 100 MG tablet Take tablet (100mg ) TWO hours prior to your cardiac CT scan.   ONE TOUCH ULTRA TEST test strip  (Patient not taking: Reported on 06/01/2024)   ONETOUCH DELICA LANCETS FINE MISC  (Patient not taking: Reported on 06/01/2024)   No facility-administered encounter medications on file as of 06/01/2024.    Vitals:   06/01/24 1353  BP: 110/70  Pulse: 98  Temp: 98.3 F (36.8 C)  Height: 5' 7 (1.702 m)  Weight: 104 lb (47.2 kg)  SpO2: 93% Comment: 5L  TempSrc: Oral  BMI (Calculated): 16.28     Physical Exam GEN: No acute distress CV: Regular rate and rhythm no murmurs LUNGS: Clear to auscultation bilaterally with normal respiratory effort and no wheeze SKIN JOINTS: Warm and dry no rash    Data Reviewed: Imaging: Screening CT chest 03/04/2024-emphysema diffuse bronchial wall thickening.  Resolution of previously noted 7.3 mm left upper lobe nodule.  New subpleural nodule measuring 6.4 mm in right upper lobe. I have reviewed the images personally  PFTs: 04/03/2022-FVC 3.05 [75%], FEV1 1.53 [51%], F/F50, TLC 7.25 [113%], DLCO 7.00 [29%] Severe obstruction, severe diffusion defect  Labs: CBC 01/24/2022-WBC 11, eos 0.5%, absolute eosinophil count 55 IgE 01/24/2022-897 Alpha-1 antitrypsin 01/24/2022-152, PI MM  Assessment & Plan Chronic obstructive pulmonary disease (COPD) on long-term supplemental oxygen  Severe COPD requiring long-term supplemental oxygen . Currently using 5 liters of oxygen  at the clinic and 6 liters at home. Reports nasal drip with oxygen  use. Breathing is well-managed on Breztri inhaler. Continues to smoke a pack of cigarettes per day, exacerbating COPD symptoms. - Continue Breztri inhaler - Discuss smoking cessation  options, including Chantix  and nicotine  patches - Encourage readiness to quit smoking  Tobacco use disorder (nicotine  dependence) Continues to smoke approximately one pack of cigarettes per day. Previous attempts to quit using patches and medications were unsuccessful. Expresses difficulty in quitting despite understanding the need to stop.  Time spent counseling-3 minutes.  Reassess at return visit - Discuss smoking cessation options, including Chantix  and nicotine  patches - Encourage readiness to quit smoking  Pulmonary nodule, right lung, under surveillance New 6.4 mm pulmonary nodule in the right lung identified on CT in July. Follow-up CT is scheduled for next month to monitor the nodule. - Ensure follow-up CT is scheduled for next month - Instruct to call the clinic if not contacted for CT scheduling  Palmar nodule, right hand, observation Palmar nodule in the right hand, non-painful, with no restriction in finger movement. - Monitor palmar nodule for changes or worsening symptoms - Consider surgical intervention if movement becomes restricted    Plan/Recommendations: Continue breztri, supplemental oxygen  Smoking cessation Follow-up CT for lung nodule  Lonna Coder MD [facility] 06/01/2024, 2:05 PM  CC: Ransom Other, MD

## 2024-06-01 NOTE — Patient Instructions (Signed)
  VISIT SUMMARY: You came in today to discuss your severe COPD and oxygen  therapy. We also reviewed your smoking habits, a new lung nodule, and a nodule in your right hand.  YOUR PLAN: CHRONIC OBSTRUCTIVE PULMONARY DISEASE (COPD) ON LONG-TERM SUPPLEMENTAL OXYGEN : You have severe COPD and are using supplemental oxygen . You are currently using 5 liters in the clinic and 6 liters at home. You mentioned experiencing nasal drip with oxygen  use and find the Breztri inhaler helpful. -Continue using the Breztri inhaler as it helps with your breathing. -We discussed smoking cessation options, including Chantix  and nicotine  patches. It's important to consider quitting smoking to help manage your COPD. -Try to be ready and motivated to quit smoking.  TOBACCO USE DISORDER (NICOTINE  DEPENDENCE): You continue to smoke about one pack of cigarettes per day and have had difficulty quitting in the past. -We discussed smoking cessation options, including Chantix  and nicotine  patches. -Try to be ready and motivated to quit smoking.  PULMONARY NODULE, RIGHT LUNG, UNDER SURVEILLANCE: A new 6.4 mm nodule was found in your right lung on a recent CT scan. A follow-up CT scan is scheduled for next month to monitor it. -Make sure your follow-up CT scan is scheduled for next month. -Call the clinic if you have not been contacted for CT scheduling.  PALMAR NODULE, RIGHT HAND, OBSERVATION: You have a non-painful nodule in your right hand that does not restrict finger movement. -Monitor the nodule for any changes or worsening symptoms. -Consider surgical intervention if the nodule starts to restrict movement.

## 2024-06-01 NOTE — Telephone Encounter (Signed)
 Copied from CRM [id number]. Topic: Clinical - Order For Equipment >> May 29, 2024 11:54 AM Isabell A wrote: Reason for CRM: Dewayne with [facility] states they received the order but patients needs to have a face to face office visit for oxygen  needs, for insurance purposes. Order will be cancelled for now, as long as he is seen in the office visit within 90 days the order can be re-opened.  Pt has appointment today.

## 2024-06-01 NOTE — Telephone Encounter (Signed)
 Pt has f/u today with PM, this can be addressed at visit. NFN

## 2024-06-01 NOTE — Telephone Encounter (Signed)
 Copied from CRM [id number]. Topic: Clinical - Order For Equipment >> May 29, 2024 12:00 PM Devaughn RAMAN wrote: Reason for CRM: Patient called regarding an order for his oxygen . Patient stated [facility] advised he needs an order for his oxygen .

## 2024-06-02 DIAGNOSIS — R531 Weakness: Secondary | ICD-10-CM | POA: Diagnosis not present

## 2024-06-02 DIAGNOSIS — E1169 Type 2 diabetes mellitus with other specified complication: Secondary | ICD-10-CM | POA: Diagnosis not present

## 2024-06-02 DIAGNOSIS — E46 Unspecified protein-calorie malnutrition: Secondary | ICD-10-CM | POA: Diagnosis not present

## 2024-06-02 DIAGNOSIS — J9611 Chronic respiratory failure with hypoxia: Secondary | ICD-10-CM | POA: Diagnosis not present

## 2024-06-02 DIAGNOSIS — Z23 Encounter for immunization: Secondary | ICD-10-CM | POA: Diagnosis not present

## 2024-06-02 DIAGNOSIS — E113291 Type 2 diabetes mellitus with mild nonproliferative diabetic retinopathy without macular edema, right eye: Secondary | ICD-10-CM | POA: Diagnosis not present

## 2024-06-02 DIAGNOSIS — J441 Chronic obstructive pulmonary disease with (acute) exacerbation: Secondary | ICD-10-CM | POA: Diagnosis not present

## 2024-06-02 DIAGNOSIS — I5032 Chronic diastolic (congestive) heart failure: Secondary | ICD-10-CM | POA: Diagnosis not present

## 2024-06-02 DIAGNOSIS — J449 Chronic obstructive pulmonary disease, unspecified: Secondary | ICD-10-CM | POA: Diagnosis not present

## 2024-06-03 ENCOUNTER — Telehealth: Payer: Self-pay

## 2024-06-03 NOTE — Telephone Encounter (Signed)
 Copied from CRM #[id number]. Topic: Clinical - Order For Equipment >> Jun 01, 2024  4:31 PM Rilla B wrote: Reason for CRM: Patient insurance changed to [facility] and he will need [facility] to supply his oxygen .  [facility] is requesting signed order, face to face notes.  FAXBETHA Vernell) [phone] or Phone:  [phone]    (Patient has chronic care needs plan)    Faxed requested paperwork to [facility].   Spoke w/ PT VBU.    -NFN

## 2024-06-04 ENCOUNTER — Telehealth: Payer: Self-pay | Admitting: Nurse Practitioner

## 2024-06-04 ENCOUNTER — Other Ambulatory Visit: Payer: Self-pay | Admitting: *Deleted

## 2024-06-04 DIAGNOSIS — J449 Chronic obstructive pulmonary disease, unspecified: Secondary | ICD-10-CM

## 2024-06-04 NOTE — Telephone Encounter (Signed)
 Per Tommye with [facility] I was following up regarding patient oxygen  order. I called the office yesterday requesting a call back to take a verbal for a set liter flow for patient. If someone could please call me at [phone]. Thank you.

## 2024-06-04 NOTE — Telephone Encounter (Signed)
 I called [facility] and spoke with Veva, she states that they cannot take a range of 4-5 liters for the oxygen , it has to be an exact liter flow.  She states the doctor's note says 6L.  They will need a new order with clear liter flow.  Advised I would put in a new order.  She verbalized understanding.  New order put in for 6L of oxygen .  Nothing further needed.

## 2024-06-05 DIAGNOSIS — J9611 Chronic respiratory failure with hypoxia: Secondary | ICD-10-CM | POA: Diagnosis not present

## 2024-06-09 NOTE — Telephone Encounter (Signed)
 Copied from CRM [id number]. Topic: Clinical - Order For Equipment >> Jun 03, 2024  2:18 PM Isabell A wrote: Reason for CRM: [facility] received order for oxygen  for this patient, requesting a corrected order sent through Epic - 1 set liter flow cant be a range.   Callback number: [phone] >> Jun 03, 2024  2:28 PM Essie A wrote: Tommye from [facility] called again regarding this patient.  She would like for a nurse to call her back at [phone] option 1.  06/04/24 new order was submitted for 6L. NFN

## 2024-06-10 ENCOUNTER — Telehealth: Payer: Self-pay | Admitting: Pulmonary Disease

## 2024-06-10 ENCOUNTER — Telehealth: Payer: Self-pay

## 2024-06-10 NOTE — Telephone Encounter (Signed)
 Copied from CRM [id number]. Topic: General - Other >> Jun 10, 2024  3:21 PM Corean SAUNDERS wrote: Reason for CRM: Patient is requesting an update as he faxed Dr. Theophilus some insurance forms to fill out on 8/18 but has not received an update but is worried as he needs to get this to his insurance company right away/ Please call patient back to advise.

## 2024-06-10 NOTE — Telephone Encounter (Signed)
 CMN received for oxygen  therapy SWO, signed and faxed to [phone]. Received fax confirmation, NFN

## 2024-06-16 NOTE — Telephone Encounter (Signed)
 Called patient; He wants insurance paperwork signed and mailed back to him. Form placed in Katie's NP box for signature.

## 2024-06-17 DIAGNOSIS — J449 Chronic obstructive pulmonary disease, unspecified: Secondary | ICD-10-CM | POA: Diagnosis not present

## 2024-06-17 DIAGNOSIS — J9611 Chronic respiratory failure with hypoxia: Secondary | ICD-10-CM | POA: Diagnosis not present

## 2024-06-17 NOTE — Telephone Encounter (Signed)
 Dr. Theophilus saw this pt last and is his primary. Sonny will place in his box for signing. If this is disability paperwork, need to make sure payment is collected by front. Thanks.

## 2024-06-18 DIAGNOSIS — J449 Chronic obstructive pulmonary disease, unspecified: Secondary | ICD-10-CM | POA: Diagnosis not present

## 2024-06-18 NOTE — Telephone Encounter (Signed)
 Placed forms in the box up front for [employer] forms

## 2024-09-02 ENCOUNTER — Telehealth: Payer: Self-pay | Admitting: Pulmonary Disease

## 2024-09-02 NOTE — Telephone Encounter (Signed)
 Copied from CRM #[id number]. Topic: Clinical - Order For Equipment >> Sep 02, 2024  9:27 AM Russell PARAS wrote: Reason for CRM:    Pt is contacting clinic regarding his oxygen  equipment. He has spoken with [facility] today and was advised that they are not in network due to him having a HMO; however, [employer] is saying he is in network. He was advised by [facility] that he will need authorization from the clinic sent to them for him to continuing using [facility].  Pt requested call back once authorization is sent  CB#  [phone]

## 2024-09-02 NOTE — Telephone Encounter (Signed)
 Called regarding CRM below- Patient is unsure what [facility] is needing, he will talk to his agent and  reach out to us  if they needing any clinic notes or providers signature.

## 2024-09-09 ENCOUNTER — Telehealth: Payer: Self-pay

## 2024-09-09 DIAGNOSIS — J449 Chronic obstructive pulmonary disease, unspecified: Secondary | ICD-10-CM

## 2024-09-09 DIAGNOSIS — J9611 Chronic respiratory failure with hypoxia: Secondary | ICD-10-CM

## 2024-09-09 NOTE — Telephone Encounter (Signed)
" ° °  Spoke with [facility] no longer in network unless patient goes to a PPO not HMO  as well patient  [facility],  new HMO wants everything to be sent to [facility]   New order to [facility] has been placed       Copied from CRM #[id number]. Topic: Clinical - Order For Equipment >> Sep 02, 2024  9:27 AM Russell PARAS wrote: Reason for CRM:    Pt is contacting clinic regarding his oxygen  equipment. He has spoken with [facility] today and was advised that they are not in network due to him having a HMO; however, [employer] is saying he is in network. He was advised by [facility] that he will need authorization from the clinic sent to them for him to continuing using [facility].  Pt requested call back once authorization is sent  CB#  [phone] >> Sep 08, 2024  9:28 AM Leila BROCKS wrote: Patient [phone] states new DME company is [facility] [phone], and needs a new order for oxygen  machine. Please advise and call back.   "

## 2024-09-14 NOTE — Telephone Encounter (Signed)
 Copied from CRM #[id number]. Topic: Clinical - Prescription Issue >> Sep 14, 2024  1:19 PM Corean SAUNDERS wrote: Reason for CRM: Patient states [facility] has not received his oxygen  unit order but per the order in his chart, [facility] should already have this. Please call patient back to advise.  Called; informed pt that order will take a while to process. Patient voiced understanding. Nfn

## 2024-09-22 ENCOUNTER — Telehealth: Payer: Self-pay | Admitting: *Deleted

## 2024-09-22 NOTE — Telephone Encounter (Signed)
 Disregard previous message

## 2024-09-22 NOTE — Telephone Encounter (Signed)
 Per Dolanda at [facility]- [facility] just got off phone with manager and we cannot provide a poc on 5 pulse dose we can only do a home set up of the 6 lpm with a 10 liter concentrator....our poc's 4 is the max we can do.  Please advise

## 2024-09-22 NOTE — Telephone Encounter (Signed)
 Per Dolanda at [facility]- This was for O2 recertification I will reach out to that team and see if anything else is needed and inform once I hear

## 2024-09-22 NOTE — Telephone Encounter (Signed)
 I have sent [facility] and urgent message

## 2024-09-22 NOTE — Telephone Encounter (Signed)
 Copied from CRM #[id number]. Topic: Clinical - Prescription Issue >> Sep 14, 2024  1:19 PM Corean SAUNDERS wrote: Reason for CRM: Patient states [facility] has not received his oxygen  unit order but per the order in his chart, [facility] should already have this. Please call patient back to advise. >> Sep 22, 2024  9:30 AM Benton KIDD wrote: Patient is calling back because they [facility] still have not got the order and its been over a week and a half and they gave me fax number for the provider to send to them the order they are waiting for  Fax number [phone]   Per [facility], [facility] phone call:  Per Dolanda at [facility]- [facility] just got off phone with manager and we cannot provide a poc on 5 pulse dose we can only do a home set up of the 6 lpm with a 10 liter concentrator....our poc's 4 is the max we can do.   Dr. Theophilus, please advise.  [facility] cannot supply a POC for this patient.

## 2024-09-22 NOTE — Telephone Encounter (Signed)
 Please advise order placed 09/09/24  Copied from CRM #[id number]. Topic: Clinical - Prescription Issue >> Sep 14, 2024  1:19 PM Corean SAUNDERS wrote: Reason for CRM: Patient states [facility] has not received his oxygen  unit order but per the order in his chart, [facility] should already have this. Please call patient back to advise. >> Sep 22, 2024  9:30 AM Benton KIDD wrote: Patient is calling back because they [facility] still have not got the order and its been over a week and a half and they gave me fax number for the provider to send to them the order they are waiting for  Fax number [phone]

## 2024-09-23 NOTE — Telephone Encounter (Signed)
 Copied from CRM #[id number]. Topic: Clinical - Prescription Issue >> Sep 14, 2024  1:19 PM Corean SAUNDERS wrote: Reason for CRM: Patient states [facility] has not received his oxygen  unit order but per the order in his chart, [facility] should already have this. Please call patient back to advise. >> Sep 23, 2024  3:58 PM Corean SAUNDERS wrote: Patient is kindly requesting an update as he needs an at home oxygen  unit asap though [facility] and will work on POC after he gets the at home unit.  >> Sep 22, 2024  9:30 AM Benton O wrote: Patient is calling back because they [facility] still have not got the order and its been over a week and a half and they gave me fax number for the provider to send to them the order they are waiting for  Fax number [phone]  ------------------------------------------------------------------------------------------------------------------------------------------------- Per Dolanda at [facility]- [facility] just got off phone with manager and we cannot provide a poc on 5 pulse dose we can only do a home set up of the 6 lpm with a 10 liter concentrator....our poc's 4 is the max we can do.   Dr. Theophilus, please advise.  [facility] cannot supply a POC for this patient.

## 2024-09-24 ENCOUNTER — Telehealth: Payer: Self-pay

## 2024-09-24 NOTE — Telephone Encounter (Signed)
 Moody Rancher, [facility]    09/24/24 12:36 PM Note Called patient regarding message below. Referral was placed for 5L POC, [facility] stated they do not have poc that goes up to 5L, Patient was informed to make an apt for another qualifying walk or  for us  to place an order for portable. Patient states he will call back  to make a decision. Awaiting patient's call.

## 2024-09-24 NOTE — Telephone Encounter (Signed)
 I am not sure what else to do if [facility] does not have a POC that works for him giving 6L.  He can come in for another walk test to see if he can get away with 4 L POC

## 2024-09-24 NOTE — Telephone Encounter (Addendum)
 Called patient regarding message below. Referral was placed for 5L POC, [facility] stated they do not have poc that goes up to 5L, Patient was informed to either make an apt for another qualifying walk or for us  to place an order for a portable tank. Patient states he will call back  to make a decision. Awaiting patient's call.  Called patient to make a decision- He said it doesn't matter and hung up. Nfn
